# Patient Record
Sex: Male | Born: 1960 | Race: Black or African American | Hispanic: No | Marital: Married | Smoking: Current every day smoker
Health system: Southern US, Community
[De-identification: ages and names within clinical notes are randomized; demographics above are authoritative.]

## PROBLEM LIST (undated history)

## (undated) DIAGNOSIS — I10 Essential (primary) hypertension: Secondary | ICD-10-CM

## (undated) DIAGNOSIS — F200 Paranoid schizophrenia: Secondary | ICD-10-CM

## (undated) DIAGNOSIS — Z789 Other specified health status: Secondary | ICD-10-CM

## (undated) DIAGNOSIS — J45909 Unspecified asthma, uncomplicated: Secondary | ICD-10-CM

## (undated) HISTORY — PX: APPENDECTOMY: SHX54

## (undated) HISTORY — DX: Essential (primary) hypertension: I10

## (undated) HISTORY — PX: APPENDECTOMY (OPEN): SHX54

---

## 2001-05-20 ENCOUNTER — Ambulatory Visit (INDEPENDENT_AMBULATORY_CARE_PROVIDER_SITE_OTHER): Admit: 2001-05-20 | Disposition: A | Discharge status: Home / Self Care | Payer: Self-pay | Source: Ambulatory Visit

## 2001-05-20 ENCOUNTER — Emergency Department: Admit: 2001-05-20 | Payer: Self-pay | Source: Emergency Department | Admitting: Emergency Medicine

## 2002-04-26 ENCOUNTER — Ambulatory Visit (INDEPENDENT_AMBULATORY_CARE_PROVIDER_SITE_OTHER): Admit: 2002-04-26 | Disposition: A | Discharge status: Home / Self Care | Payer: Self-pay | Source: Ambulatory Visit

## 2003-09-07 ENCOUNTER — Emergency Department: Admit: 2003-09-07 | Payer: Self-pay | Source: Emergency Department | Admitting: Internal Medicine

## 2003-09-07 ENCOUNTER — Ambulatory Visit: Admit: 2003-09-07 | Disposition: A | Discharge status: Home / Self Care | Payer: Self-pay | Source: Ambulatory Visit

## 2004-08-07 ENCOUNTER — Emergency Department: Admit: 2004-08-07 | Payer: Self-pay | Source: Emergency Department | Admitting: Emergency Medicine

## 2004-08-07 ENCOUNTER — Ambulatory Visit: Admit: 2004-08-07 | Disposition: A | Discharge status: Home / Self Care | Payer: Self-pay | Source: Ambulatory Visit

## 2011-04-26 ENCOUNTER — Ambulatory Visit (INDEPENDENT_AMBULATORY_CARE_PROVIDER_SITE_OTHER)
Admit: 2011-04-26 | Discharge: 2011-04-26 | Disposition: A | Discharge status: Home / Self Care | Payer: Self-pay | Source: Ambulatory Visit

## 2011-04-26 ENCOUNTER — Emergency Department
Admit: 2011-04-26 | Disposition: A | Discharge status: Home / Self Care | Payer: Self-pay | Source: Emergency Department | Admitting: Emergency Medicine

## 2014-05-18 ENCOUNTER — Ambulatory Visit
Admission: RE | Admit: 2014-05-18 | Discharge: 2014-05-18 | Disposition: A | Discharge status: Home / Self Care | Source: Ambulatory Visit

## 2014-05-18 ENCOUNTER — Emergency Department

## 2014-05-18 ENCOUNTER — Emergency Department: Payer: Self-pay

## 2014-05-18 ENCOUNTER — Emergency Department
Admission: EM | Admit: 2014-05-18 | Discharge: 2014-05-18 | Disposition: A | Discharge status: Home / Self Care | Attending: Emergency Medicine | Admitting: Emergency Medicine

## 2014-05-18 DIAGNOSIS — S8010XA Contusion of unspecified lower leg, initial encounter: Secondary | ICD-10-CM | POA: Insufficient documentation

## 2014-05-18 DIAGNOSIS — IMO0002 Reserved for concepts with insufficient information to code with codable children: Secondary | ICD-10-CM | POA: Insufficient documentation

## 2014-05-18 DIAGNOSIS — Y9289 Other specified places as the place of occurrence of the external cause: Secondary | ICD-10-CM | POA: Insufficient documentation

## 2014-05-18 DIAGNOSIS — I10 Essential (primary) hypertension: Secondary | ICD-10-CM | POA: Insufficient documentation

## 2014-05-18 DIAGNOSIS — S8012XA Contusion of left lower leg, initial encounter: Secondary | ICD-10-CM

## 2014-05-18 DIAGNOSIS — F17200 Nicotine dependence, unspecified, uncomplicated: Secondary | ICD-10-CM | POA: Insufficient documentation

## 2014-05-18 DIAGNOSIS — S80812A Abrasion, left lower leg, initial encounter: Secondary | ICD-10-CM

## 2014-05-18 NOTE — ED Notes (Addendum)
Pt d/c to home. Stable, ambulates with steady gait. All belongings with pt.     Notified Dr Salley Scarlet regarding BP of 164/98. No new orders at this time. Pt is upset that he has to wait to get a drug screen in which pt's work had requested.

## 2014-05-18 NOTE — ED Provider Notes (Signed)
Physician/Midlevel provider first contact with patient: 05/18/14 0403             Physician/Midlevel provider first contact with patient: 05/18/14 0403          [facility] EMERGENCY DEPARTMENT H&P         CLINICAL SUMMARY          Diagnosis:    .     Final diagnoses:   Traumatic hematoma of lower leg, left, initial encounter   Abrasion, lower leg, anterior, left, initial encounter         MDM Notes:    MDM, Differential Diagnosis (not completely inclusive) and Plan: Leg injury- possible fx, likely hematoma.  Will check xray, po pain control.             Disposition:      ED Disposition    Discharge Lance Lara discharge to home/self care.    Condition at disposition: Stable            New Prescriptions    No medications on file                 CLINICAL INFORMATION        HPI:      Chief Complaint: Leg Injury  .    Lance Lara is a 53 y.o. male who presents with pain to left shin.  Patient relates while at work a metal ramp (patient relates approx. 300 pounds) slid back and struck him in shin.  Patient fell backwards but denies other injury- no head injury, no loc.  Last Td <5years ago.  Injury occurred prior to arrival in ED.  "Shooting" pain, radiates up left leg, 8/10, abrupt onset, pain increases with ambulation.       History obtained from: Patient      Symptoms have worsened in past 24-48 hours: yes    PMD   No primary care provider on file.      ROS:      -Documented in HPI. Otherwise all other systems reviewed and negative.   -Nursing notes reviewed by me.         Physical Exam:      PE   BP: 167/94 mmHg, Temp: 98.4 F (36.9 C), Heart Rate: 89, Resp Rate: 18, SpO2: 99 %, Height: 175.3 cm, Weight: 74.844 kg    Pulse Oximetry Analysis -99 % on room air; Normal  Vitals reviewed   GEN: . Nontoxic, Well appearing, NAD.          Head: NC/AT.       Eyes: EOMI w/o pain, nl conjunctiva. NO d/c.         ENT: MMM, symmetric OP, no drooling/trismus.          Neck: FROM, supple, no masses, no tenderness.          Chest: ctab, NO resp distress. Equal chest rise.          CV: rrr, NO significant murmur appreciated.          Abd: no peritoneal signs, soft, benign, no distention, no tenderness             Back: NO CVAt, NO midline ttp.         UpperExt: NO deformity. FROM, neurovasc intact.         LowerExt: NO edema. FROM, neurovasc intact.    Abrasion and hematoma to left distal tib/fib, no obvious deformity, +ttp, no pain in ankle or knee to plapation  Neuro: moving all ext equally, no motor deficits, normal sensation     Skin: Warm and dry. NO rash.          Psych: Normal affect. Normal insight.                      PAST HISTORY        Primary Care Provider: No primary care provider on file.        PMH/PSH:    .     Past Medical History   Diagnosis Date   . Hypertension        He has past surgical history that includes Appendectomy.      Social/Family History:      He reports that he has been smoking.  He does not have any smokeless tobacco history on file. He reports that he drinks alcohol. He reports that he does not use illicit drugs.    History reviewed. No pertinent family history.      Listed Medications on Arrival:    .     Previous Medications    No medications on file      Allergies: He is allergic to ciprofloxacin.            VISIT INFORMATION        Clinical Course in the ED:            Medications Given in the ED:    .     ED Medication Orders    None            Procedures:      Procedures      Interpretations:      EKG: none     Cardiac monitor interpretation by me, Dr. Lucah Petta:none  Radiologic Studies Interpreted (viewed) by me, Dr. Salley Scarlet: neg for fx  Laboratory results reviewed by EDP: YES   Radiologic study results reviewed by EDP: YES               RESULTS        Lab Results:      Results    ** No results found for the last 24 hours. **              Radiology Results:      Tibia Fibula Left AP And Lateral    (Results Pending)               Scribe Attestation:      No scribe involved in the  care of this patient       Noralyn Pick, MD             Judi Saa, MD  05/18/14 [phone]

## 2014-05-18 NOTE — Discharge Instructions (Signed)
Abrasion    You have been diagnosed with an abrasion. This is a scrape of the outer skin layers.    Take off old dressings every day. Then put on a clean, dry dressing. If the dressing sticks to the wound, moisten it with water. This way, it can come off more easily.    Keep the wound clean and dry for the next 24 hours. You can wash the wound gently with soap and water. Then put on a dry bandage if needed, to protect it.    Put a thin layer of antibiotic ointment on the wound 2-3 times a day. This can be Polysporin / triple antibiotic. This can help prevent infection. It may help keep scarring to a minimum.    YOU SHOULD SEEK MEDICAL ATTENTION IMMEDIATELY, EITHER HERE OR AT THE NEAREST EMERGENCY DEPARTMENT, IF ANY OF THE FOLLOWING OCCURS:   Unusual redness or swelling.   There are red streaks going up the arm or leg.   The wound smells bad or has a lot of drainage.   Fever (temperature higher than 100.58F / 38C), chills, more pain and / or swelling.    Hematoma    You have been diagnosed with a hematoma.    A hematoma is a collection of blood under the skin usually caused by injury to the area. In other words, it is a very large bruise.    Put ice on the area 15 minutes out of every hour to help with swelling and pain. Putting ice on the affected area can reduce pain and swelling. Put some ice cubes in a re-sealable (Ziploc) bag. Add some water. Put a thin washcloth between the bag and the skin. Apply the ice bag to the area for at least 20 minutes. Do this at least 4 times per day. Longer times and more often are OK. NEVER APPLY ICE DIRECTLY TO THE SKIN! If your injury is on your hand or foot, lift it above the level of your heart to help with swelling.    YOU SHOULD SEEK MEDICAL ATTENTION IMMEDIATELY, EITHER HERE OR AT THE NEAREST EMERGENCY DEPARTMENT, IF ANY OF THE FOLLOWING OCCURS:   The hematoma keeps getting bigger.   Fever (temperature higher than 100.58F / 38C) or shaking  chills.   Redness that surrounds or spreads outward from the area.   Breakdown of the skin in the area affected.   Very bad-smelling drainage from the injured area.         skin. Apply the ice bag for at least 20 minutes. Do this at least 4 times per day. It's okay to apply ice longer or more often. NEVER APPLY ICE DIRECTLY TO THE SKIN. Always keep a washcloth between the ice pack and your body.    You have been given an ACE BANDAGE. The bandage will compress (squeeze) the injured area. This increases comfort and reduces swelling. The ACE bandage should fit snugly but not so tight as to decrease circulation (blood supply). Watch for swelling of the area outside the ACE wrap. Check capillary refill (circulation) in your fingernails or toenails. To do this, press on the nail. It should turn white. When you let go, the nail should return to pink in less than 2 seconds. If it doesn't, the bandage is too tight. Loosen the wrap if you need to.   Wear the ACE bandage as needed for comfort for the next few days.    YOU SHOULD SEEK MEDICAL ATTENTION IMMEDIATELY, EITHER HERE OR  ANY OF THE FOLLOWING OCCURS:   Your pain or swelling gets much worse.   You develop new numbness or tingling in or below the affected area.   Your foot or hand looks cold or pale. This could mean there is a problem with circulation (blood supply).

## 2014-05-18 NOTE — ED Notes (Signed)
onset about 0230. pt was at work and was hit by a metal ramp to left lower leg, pain is now shooting up left upper thigh. full ROM. pain increase with ambulation.

## 2019-02-18 ENCOUNTER — Ambulatory Visit
Admission: RE | Admit: 2019-02-18 | Discharge: 2019-02-18 | Disposition: A | Discharge status: Home / Self Care | Payer: Self-pay | Source: Ambulatory Visit | Attending: Family Medicine | Admitting: Family Medicine

## 2019-02-18 ENCOUNTER — Other Ambulatory Visit: Payer: Self-pay | Admitting: Family Medicine

## 2019-02-18 DIAGNOSIS — M5416 Radiculopathy, lumbar region: Secondary | ICD-10-CM

## 2019-05-12 ENCOUNTER — Encounter (HOSPITAL_COMMUNITY): Payer: Self-pay

## 2019-05-12 ENCOUNTER — Emergency Department (HOSPITAL_COMMUNITY)
Admission: EM | Admit: 2019-05-12 | Discharge: 2019-05-13 | Disposition: A | Discharge status: Other Acute Inpatient Hospital | Payer: No Typology Code available for payment source | Attending: Emergency Medicine | Admitting: Emergency Medicine

## 2019-05-12 ENCOUNTER — Other Ambulatory Visit: Payer: Self-pay

## 2019-05-12 DIAGNOSIS — Z79899 Other long term (current) drug therapy: Secondary | ICD-10-CM | POA: Insufficient documentation

## 2019-05-12 DIAGNOSIS — Z046 Encounter for general psychiatric examination, requested by authority: Secondary | ICD-10-CM

## 2019-05-12 DIAGNOSIS — F333 Major depressive disorder, recurrent, severe with psychotic symptoms: Secondary | ICD-10-CM | POA: Insufficient documentation

## 2019-05-12 DIAGNOSIS — R45851 Suicidal ideations: Secondary | ICD-10-CM | POA: Insufficient documentation

## 2019-05-12 DIAGNOSIS — Z20828 Contact with and (suspected) exposure to other viral communicable diseases: Secondary | ICD-10-CM | POA: Insufficient documentation

## 2019-05-12 LAB — CBC
HCT: 48.3 % (ref 39.0–52.0)
Hemoglobin: 16.1 g/dL (ref 13.0–17.0)
MCH: 30.7 pg (ref 26.0–34.0)
MCHC: 33.3 g/dL (ref 30.0–36.0)
MCV: 92.2 fL (ref 80.0–100.0)
Platelets: 245 10*3/uL (ref 150–400)
RBC: 5.24 MIL/uL (ref 4.22–5.81)
RDW: 14.6 % (ref 11.5–15.5)
WBC: 11.4 10*3/uL — ABNORMAL HIGH (ref 4.0–10.5)
nRBC: 0 % (ref 0.0–0.2)

## 2019-05-12 LAB — ETHANOL: Alcohol, Ethyl (B): <10 mg/dL (ref <10)

## 2019-05-12 LAB — RAPID URINE DRUG SCREEN, HOSP PERFORMED
Amphetamines: NOT DETECTED
Barbiturates: NOT DETECTED
Benzodiazepines: NOT DETECTED
Cocaine: NOT DETECTED
Opiates: NOT DETECTED
Tetrahydrocannabinol: NOT DETECTED

## 2019-05-12 LAB — COMPREHENSIVE METABOLIC PANEL
ALT: 21 U/L (ref 0–44)
AST: 16 U/L (ref 15–41)
Albumin: 4.5 g/dL (ref 3.5–5.0)
Alkaline Phosphatase: 66 U/L (ref 38–126)
Anion gap: 9 (ref 5–15)
BUN: 15 mg/dL (ref 6–20)
CO2: 25 mmol/L (ref 22–32)
Calcium: 9.1 mg/dL (ref 8.9–10.3)
Chloride: 104 mmol/L (ref 98–111)
Creatinine, Ser: 1.18 mg/dL (ref 0.61–1.24)
GFR calc Af Amer: >60 mL/min (ref >60)
GFR calc non Af Amer: >60 mL/min (ref >60)
Glucose, Bld: 86 mg/dL (ref 70–99)
Potassium: 4.1 mmol/L (ref 3.5–5.1)
Sodium: 138 mmol/L (ref 135–145)
Total Bilirubin: 0.4 mg/dL (ref 0.3–1.2)
Total Protein: 7.9 g/dL (ref 6.5–8.1)

## 2019-05-12 LAB — ACETAMINOPHEN LEVEL: Acetaminophen (Tylenol), Serum: <10 ug/mL — ABNORMAL LOW (ref 10–30)

## 2019-05-12 LAB — SALICYLATE LEVEL: Salicylate Lvl: <7 mg/dL (ref 2.8–30.0)

## 2019-05-12 MED ORDER — LORAZEPAM 2 MG/ML IJ SOLN
1.0000 mg | Freq: Once | INTRAMUSCULAR | Status: DC | PRN
  Filled 2019-05-12: qty 1

## 2019-05-12 MED ORDER — IBUPROFEN 200 MG PO TABS: 600.0000 mg | ORAL_TABLET | Freq: Three times a day (TID) | ORAL | Status: DC | PRN

## 2019-05-12 MED ORDER — ZOLPIDEM TARTRATE 5 MG PO TABS: 5.0000 mg | ORAL_TABLET | Freq: Every evening | ORAL | Status: DC | PRN

## 2019-05-12 MED ORDER — OLANZAPINE 10 MG IM SOLR: 5.0000 mg | Freq: Once | INTRAMUSCULAR | Status: DC | PRN

## 2019-05-12 MED ORDER — OLANZAPINE 5 MG PO TBDP: 5.0000 mg | ORAL_TABLET | Freq: Once | ORAL | Status: DC | PRN

## 2019-05-12 MED ORDER — LORAZEPAM 1 MG PO TABS: 1.0000 mg | ORAL_TABLET | ORAL | Status: DC | PRN

## 2019-05-12 NOTE — ED Notes (Signed)
Brought in by [facility] after first going to [facility] and being told they were full and brought him over here. Sent back to triage and then brought back here. He is pending IVC papers. [employer] present with him now. He has a flat affect, stares unblinkingly at Probation officer during interview. Slow to respond with answers and vague. Denies any hx of mental health issues and doesn't recognize any dx when Probation officer listed several for him. He denies being on any medicines. Denies pain or medical issues and other than smokes cigarettes he denies drug and ETOH use.  Urine collected, waiting on results. He does endorse at night feeling like someone is trying to "mess with him" but was not more specific. He has an unsettling affect. IVC papers initiated by his therapist stating he is not taking his meds, acting bizarre and feels [place] has him and he feels unsafe and has been wanting to sleep in the bed of his elderly aunt.

## 2019-05-12 NOTE — ED Provider Notes (Signed)
[facility] DEPT Provider Note   CSN: [id number] Arrival date & time: 05/12/19  2112     History   Chief Complaint Chief Complaint  Patient presents with  . IVC  . Suicidal    HPI Everette Fitzwater is a 58 y.o. male.     The history is provided by the patient and medical records.    58 year old male with history of hypertension and psychiatric illness, presenting to the ED under IVC petition by his therapist.  Apparently he was taken to behavioral health by police for psychiatric evaluation.  Apparently patient had been calling 911 multiple times today, so [employer] actually went to his home to arrest him for misuse of emergency services, but family spoke with them and told them full extent of what is been going on.  Apparently he is not been taking his medications, has been complaining that someone is "messing with him" at night, and that the devil is out to get him.  He also made a statement to the police about wanting to sleep in the bed with his elderly aunt so that he "can be safe".  There was no statements made about abuse.  Here, patient is denying any history of psychiatric disease.  He denies any alcohol or illicit drug use.  He denies any physical complaints, just states "I want to go home".  History reviewed. No pertinent past medical history.  There are no active problems to display for this patient.   History reviewed. No pertinent surgical history.      Home Medications    Prior to Admission medications   Medication Sig Start Date End Date Taking? Authorizing Provider  amLODipine (NORVASC) 10 MG tablet Take 10 mg by mouth daily. 04/04/19  Yes [provider]  PROAIR HFA 108 (90 Base) MCG/ACT inhaler Inhale 1 puff into the lungs every 6 (six) hours as needed for wheezing. 12/24/18  Yes [provider]    Family History History reviewed. No pertinent family history.  Social History Social History   Tobacco Use  .  Smoking status: Not on file  Substance Use Topics  . Alcohol use: Not on file  . Drug use: Not on file     Allergies   Ciprofloxacin hcl   Review of Systems Review of Systems  Psychiatric/Behavioral:       IVC  All other systems reviewed and are negative.    Physical Exam Updated Vital Signs BP (!) 156/91 (BP Location: Left Arm)   Pulse 87   Resp 16   SpO2 99%   Physical Exam Vitals signs and nursing note reviewed.  Constitutional:      Appearance: He is well-developed.  HENT:     Head: Normocephalic and atraumatic.  Eyes:     Conjunctiva/sclera: Conjunctivae normal.     Pupils: Pupils are equal, round, and reactive to light.  Neck:     Musculoskeletal: Normal range of motion.  Cardiovascular:     Rate and Rhythm: Normal rate and regular rhythm.     Heart sounds: Normal heart sounds.  Pulmonary:     Effort: Pulmonary effort is normal.     Breath sounds: Normal breath sounds.  Abdominal:     General: Bowel sounds are normal.     Palpations: Abdomen is soft.  Musculoskeletal: Normal range of motion.  Skin:    General: Skin is warm and dry.  Neurological:     Mental Status: He is alert and oriented to person, place, and  time.  Psychiatric:     Comments: Very bizarre affect, flat, staring off in space throughout exam Harsh answers to questions without much detail      ED Treatments / Results  Labs (all labs ordered are listed, but only abnormal results are displayed) Labs Reviewed  ACETAMINOPHEN LEVEL - Abnormal; Notable for the following components:      Result Value   Acetaminophen (Tylenol), Serum <10 (*)    All other components within normal limits  CBC - Abnormal; Notable for the following components:   WBC 11.4 (*)    All other components within normal limits  SARS CORONAVIRUS 2 (HOSPITAL ORDER, PERFORMED IN [facility])  COMPREHENSIVE METABOLIC PANEL  ETHANOL  SALICYLATE LEVEL  RAPID URINE DRUG SCREEN, HOSP PERFORMED    EKG  None  Radiology No results found.  Procedures Procedures (including critical care time)  Medications Ordered in ED Medications  zolpidem (AMBIEN) tablet 5 mg (has no administration in time range)  ibuprofen (ADVIL) tablet 600 mg (has no administration in time range)  OLANZapine (ZYPREXA) injection 5 mg (has no administration in time range)  LORazepam (ATIVAN) injection 1 mg (has no administration in time range)     Initial Impression / Assessment and Plan / ED Course  I have reviewed the triage vital signs and the nursing notes.  Pertinent labs & imaging results that were available during my care of the patient were reviewed by me and considered in my medical decision making (see chart for details).  58 year old male here under IVC petitioned by family due to erratic behavior.  He is apparently been complaining that the devil has hold of him, people are out to get him, and even made some statements about wanting to sleep in the bed with his elderly aunt for protection.  There was no complaints of abuse.  He is apparently been off of his psychiatric medications for unknown period of time.  Here patient's affect is extremely odd, he will not maintain eye contact, answers are brief and harsh without any supporting details.  He is adamantly denying any history of psychiatric disease and states he wants to go home.  Denies any alcohol or illicit drug use.  Screening labs obtained and are overall reassuring.  Patient is medically cleared.  TTS has evaluated, recommends IP placement.  [facility], will seek outside placement.  Will obtain COVID screen.  Final Clinical Impressions(s) / ED Diagnoses   Final diagnoses:  Involuntary commitment    ED Discharge Orders    None       Garlon HatchetSanders, Yaneli Keithley M, PA-C 05/13/19 [id number]    Marily MemosMesner, Jason, MD 05/13/19 [phone]
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
## 2019-05-13 ENCOUNTER — Encounter (HOSPITAL_COMMUNITY): Payer: Self-pay | Admitting: *Deleted

## 2019-05-13 ENCOUNTER — Observation Stay (HOSPITAL_COMMUNITY)
Admission: AD | Admit: 2019-05-13 | Discharge: 2019-05-14 | Disposition: A | Discharge status: Home / Self Care | Payer: Federal, State, Local not specified - Other | Source: Intra-hospital | Attending: Psychiatry | Admitting: Psychiatry

## 2019-05-13 DIAGNOSIS — F333 Major depressive disorder, recurrent, severe with psychotic symptoms: Secondary | ICD-10-CM

## 2019-05-13 DIAGNOSIS — F4323 Adjustment disorder with mixed anxiety and depressed mood: Secondary | ICD-10-CM | POA: Diagnosis not present

## 2019-05-13 DIAGNOSIS — Z79899 Other long term (current) drug therapy: Secondary | ICD-10-CM | POA: Insufficient documentation

## 2019-05-13 DIAGNOSIS — F332 Major depressive disorder, recurrent severe without psychotic features: Secondary | ICD-10-CM | POA: Diagnosis not present

## 2019-05-13 DIAGNOSIS — R45851 Suicidal ideations: Secondary | ICD-10-CM | POA: Insufficient documentation

## 2019-05-13 DIAGNOSIS — F23 Brief psychotic disorder: Secondary | ICD-10-CM | POA: Diagnosis present

## 2019-05-13 DIAGNOSIS — F17210 Nicotine dependence, cigarettes, uncomplicated: Secondary | ICD-10-CM | POA: Insufficient documentation

## 2019-05-13 DIAGNOSIS — Z888 Allergy status to other drugs, medicaments and biological substances status: Secondary | ICD-10-CM | POA: Insufficient documentation

## 2019-05-13 DIAGNOSIS — J45909 Unspecified asthma, uncomplicated: Secondary | ICD-10-CM | POA: Diagnosis not present

## 2019-05-13 DIAGNOSIS — G4700 Insomnia, unspecified: Secondary | ICD-10-CM | POA: Diagnosis not present

## 2019-05-13 DIAGNOSIS — Z20828 Contact with and (suspected) exposure to other viral communicable diseases: Secondary | ICD-10-CM | POA: Diagnosis not present

## 2019-05-13 DIAGNOSIS — I10 Essential (primary) hypertension: Secondary | ICD-10-CM | POA: Insufficient documentation

## 2019-05-13 HISTORY — DX: Essential (primary) hypertension: I10

## 2019-05-13 HISTORY — DX: Unspecified asthma, uncomplicated: J45.909

## 2019-05-13 HISTORY — DX: Other specified health status: Z78.9

## 2019-05-13 LAB — SARS CORONAVIRUS 2 BY RT PCR (HOSPITAL ORDER, PERFORMED IN ~~LOC~~ HOSPITAL LAB): SARS Coronavirus 2: NEGATIVE

## 2019-05-13 MED ORDER — HALOPERIDOL 2 MG PO TABS: 2.0000 mg | ORAL_TABLET | Freq: Two times a day (BID) | ORAL | Status: DC

## 2019-05-13 MED ORDER — HYDROXYZINE HCL 25 MG PO TABS
25.0000 mg | ORAL_TABLET | Freq: Three times a day (TID) | ORAL | Status: DC | PRN
  Administered 2019-05-13: 25 mg via ORAL
  Filled 2019-05-13: qty 1

## 2019-05-13 MED ORDER — HALOPERIDOL 2 MG PO TABS
2.0000 mg | ORAL_TABLET | Freq: Two times a day (BID) | ORAL | Status: DC
  Administered 2019-05-13: 19:00:00 2 mg via ORAL
  Filled 2019-05-13: qty 1

## 2019-05-13 MED ORDER — ALUM & MAG HYDROXIDE-SIMETH 200-200-20 MG/5ML PO SUSP: 30.0000 mL | ORAL | Status: DC | PRN

## 2019-05-13 MED ORDER — TRAZODONE HCL 50 MG PO TABS
50.0000 mg | ORAL_TABLET | Freq: Every evening | ORAL | Status: DC | PRN
  Administered 2019-05-13: 23:00:00 50 mg via ORAL
  Filled 2019-05-13: qty 1

## 2019-05-13 MED ORDER — MAGNESIUM HYDROXIDE 400 MG/5ML PO SUSP: 30.0000 mL | Freq: Every day | ORAL | Status: DC | PRN

## 2019-05-13 MED ORDER — HALOPERIDOL 1 MG PO TABS: 2.0000 mg | ORAL_TABLET | Freq: Two times a day (BID) | ORAL | Status: DC

## 2019-05-13 MED ORDER — FLUOXETINE HCL 10 MG PO CAPS: 10.0000 mg | ORAL_CAPSULE | Freq: Every day | ORAL | Status: DC

## 2019-05-13 MED ORDER — IBUPROFEN 600 MG PO TABS: 600.0000 mg | ORAL_TABLET | Freq: Three times a day (TID) | ORAL | Status: DC | PRN

## 2019-05-13 MED ORDER — LORAZEPAM 2 MG/ML IJ SOLN
2.0000 mg | Freq: Once | INTRAMUSCULAR | Status: AC
  Administered 2019-05-13: 2 mg via INTRAVENOUS

## 2019-05-13 MED ORDER — ZIPRASIDONE MESYLATE 20 MG IM SOLR
INTRAMUSCULAR | Status: AC
  Filled 2019-05-13: qty 20

## 2019-05-13 MED ORDER — OLANZAPINE 10 MG IM SOLR: 5.0000 mg | Freq: Once | INTRAMUSCULAR | Status: DC | PRN

## 2019-05-13 MED ORDER — ZIPRASIDONE MESYLATE 20 MG IM SOLR
20.0000 mg | Freq: Once | INTRAMUSCULAR | Status: AC
  Administered 2019-05-13: 07:00:00 20 mg via INTRAMUSCULAR

## 2019-05-13 MED ORDER — ACETAMINOPHEN 325 MG PO TABS: 650.0000 mg | ORAL_TABLET | Freq: Four times a day (QID) | ORAL | Status: DC | PRN

## 2019-05-13 NOTE — Progress Notes (Signed)
Admit Note Pt is IVCD,admitted to OBS unit from [facility] due to increased paranoia and poor judgement . Pt is delusional." It is judgement day and people are putting stuff in your black and mild cigars and telling you to smoke it."Pt reports he lost his job and that people are playing mind games the patient states he has 4 daughters but they can't be trusted either because they're with her.Pt lives his Elenor Legato who is 58 y/o [phone].Pt also feels people are sneaking in his bed at night and having sex with him.. Medical HX includes HTN, Oriented to the unit, Education provided about safety on the unit, including fall prevention. Nutrition offered, safety checks initiated every 15 minutes. Search completed.

## 2019-05-13 NOTE — ED Notes (Addendum)
Pt walked into hall asking to leave. Pt was then asked by staff to return to his room, Pt asked writer "why?' and continued to walked towards the door. [employer] had to redirect pt back to his room.

## 2019-05-13 NOTE — Progress Notes (Signed)

## 2019-05-13 NOTE — Plan of Care (Signed)
[facility] Observation Crisis Plan  Reason for Crisis Plan:  Crisis Stabilization   Plan of Care:  Referral for Inpatient Hospitalization  Family Support:      Current Living Environment:  Living Arrangements: Other relatives  Insurance:   Hospital Account    Name Acct ID Class Status Primary Coverage   Fernando Ferguson, Fernando Ferguson [id number] [place] [facility]/[place]        Guarantor Account (for Hospital Account [id number])    Name Relation to [place]? Acct Type   Fernando Ferguson Self [facility] Yes [facility]   Address Phone       [address] [place], [place] [address] [phone])          Coverage Information (for Hospital Account [id number])    F/O Payor/Plan Precert #   [place] [facility]/[place] #   Fernando Ferguson, Fernando Ferguson [id number]   Address Phone   [address] [place],  [address] [phone]      Legal Guardian:     Primary Care Provider:  Vassie Moment, MD  Current Outpatient Providers:  Beverly Sessions  Psychiatrist:     Counselor/Therapist:     Compliant with Medications:  No  Additional Information:   Fernando Ferguson 8/7/20207:32 PM

## 2019-05-13 NOTE — BH Assessment (Signed)
[place] Assessment Progress Note  Per Renetta Chalk, NP, this pt meets criteria for IVC, and is to be admitted to the [facility] Observation Unit.  Letitia Libra, RN, [facility] has assigned pt to [facility].  Pt presents under IVC initiated by a Special educational needs teacher, and upheld by EDP Virgel Manifold, MD, and IVC documents have been faxed to [facility].  Pt's nurse, Diane, has been notified, and agrees to call report to [phone].  Pt is to be transported via Event organ[employer].   Jalene Mullet, [place] Coordinator [phone]

## 2019-05-13 NOTE — ED Notes (Signed)
Tech approached him this am to get a set of vitals on him, he responded "you all are playing a game" he did allow her to take his vitals, Probation officer had officer stand near by. He seems paranoid.

## 2019-05-13 NOTE — ED Notes (Signed)
Pt came up to desk asking for his wallet. Pt was asked to go back to his room several times. [employer] had to escort pt back to his room because he refused to go when staff and security asked him to.

## 2019-05-13 NOTE — Progress Notes (Signed)
In hallway, stating to "watch your back in this world." reports SI thoughts and "am just ready to go."  Appears paranoid and restless, pacing in hallway and in and out of dayroom. Medications given as scheduled. Slight resistance, with encouragement medication taken. Spoke to [employer] on the phone. Pt is safe

## 2019-05-13 NOTE — ED Notes (Signed)
Transported to [facility] Obs Unit by [employer]. Belongings returned to [facility] for pt. Pt was calm and cooperative.

## 2019-05-13 NOTE — H&P (Addendum)
[facility] Provider Admission PAA/H&P  Patient Identification: Lenell Lama MRN:  [id number] Date of Evaluation:  05/13/2019 Chief Complaint:  mdd recurrent severe  Principal Diagnosis: Brief psychotic disorder ([place]) Diagnosis:  Principal Problem:   Brief psychotic disorder ([place])  History of Present Illness: Pt presented to [facility] with IVC paperwork taken out by his aunt, who he lives with. Please see TTS consult note for collateral from Pt's aunt. In short, he is going through a divorce and custody battle, he is not into drugs, alcohol or in trouble, he works, and he has been talking about suicide and thinking people are after him.  Today, during evaluation, He stated that he has not been sleeping because he thinks someone is coming in the house and playing tricks on him. He stated he hasn't been eating because someone is throwing the food in the trash and he has to get it out. He stated he has never been in a psychiatric hospital and has never taken any medication for his mental health. He did state he saw a Dr the other day and was given something for his blood pressure, he thinks it started with an L. His pressure on admission to OBS was 140/94 standing. He also stated the Dr's office was over near [facility]. He rambles and does not make a lot of sense. His aunt indicated that he has been like this since they started switching his shifts at work. He is fearful to be alone and has even asked his aunt (she is 41) if he can sleep in the bed with her because he is frightened. He is pleasant, polite and calm. He ate some dinner and asked for a nicotine patch and gum. He will remain in OBS overnight and be re-evaluated by psychiatry in the AM. With no history in Epic and no antipsychotics listed, will try Haldol 2 mg BID for psychosis and sleep.   Associated Signs/Symptoms: Depression Symptoms:  depressed mood, anhedonia, insomnia, difficulty concentrating, suicidal thoughts without  plan, anxiety, disturbed sleep, decreased appetite, (Hypo) Manic Symptoms:  Delusions, Distractibility, Hallucinations, Anxiety Symptoms:  Excessive Worry, Psychotic Symptoms:  Delusions, Paranoia, PTSD Symptoms: NA Total Time spent with patient: 30 minutes  Past Psychiatric History: None known  Is the patient at risk to self? Yes.    Has the patient been a risk to self in the past 6 months? No.  Has the patient been a risk to self within the distant past? No.  Is the patient a risk to others? No.  Has the patient been a risk to others in the past 6 months? No.  Has the patient been a risk to others within the distant past? No.   Prior Inpatient Therapy:  None Prior Outpatient Therapy:  None  Alcohol Screening:   Substance Abuse History in the last 12 months:  No. Consequences of Substance Abuse: NA Previous Psychotropic Medications: Pt denies Psychological Evaluations: No  Past Medical History: No past medical history on file. No past surgical history on file. Family History: No family history on file. Family Psychiatric History: Pt did not give this information Tobacco Screening:  Yes Social History:  Social History   Substance and Sexual Activity  Alcohol Use None     Social History   Substance and Sexual Activity  Drug Use Not on file    Additional Social History:  Allergies:   Allergies  Allergen Reactions  . Ciprofloxacin Hcl Swelling   Lab Results:  Results for orders placed or  performed during the hospital encounter of 05/12/19 (from the past 48 hour(s))  Comprehensive metabolic panel     Status: None   Collection Time: 05/12/19  9:48 PM  Result Value Ref Range   Sodium 138 135 - 145 mmol/L   Potassium 4.1 3.5 - 5.1 mmol/L   Chloride 104 98 - 111 mmol/L   CO2 25 22 - 32 mmol/L   Glucose, Bld 86 70 - 99 mg/dL   BUN 15 6 - 20 mg/dL   Creatinine, Ser 2.951.18 0.61 - 1.24 mg/dL   Calcium 9.1 8.9 - 62.110.3 mg/dL   Total Protein 7.9 6.5 - 8.1 g/dL    Albumin 4.5 3.5 - 5.0 g/dL   AST 16 15 - 41 U/L   ALT 21 0 - 44 U/L   Alkaline Phosphatase 66 38 - 126 U/L   Total Bilirubin 0.4 0.3 - 1.2 mg/dL   GFR calc non Af Amer >60 >60 mL/min   GFR calc Af Amer >60 >60 mL/min   Anion gap 9 5 - 15    Comment: Performed at [facility], [address] [place], [place] [address]  Ethanol     Status: None   Collection Time: 05/12/19  9:48 PM  Result Value Ref Range   Alcohol, Ethyl (B) <10 <10 mg/dL    Comment: (NOTE) Lowest detectable limit for serum alcohol is 10 mg/dL. For medical purposes only. Performed at [facility], [address] [place], [place] [address]   Salicylate level     Status: None   Collection Time: 05/12/19  9:48 PM  Result Value Ref Range   Salicylate Lvl <7.0 2.8 - 30.0 mg/dL    Comment: Performed at [facility], [address] [place], [place] [address]  Acetaminophen level     Status: Abnormal   Collection Time: 05/12/19  9:48 PM  Result Value Ref Range   Acetaminophen (Tylenol), Serum <10 (L) 10 - 30 ug/mL    Comment: (NOTE) Therapeutic concentrations vary significantly. A range of 10-30 ug/mL  may be an effective concentration for many patients. However, some  are best treated at concentrations outside of this range. Acetaminophen concentrations >150 ug/mL at 4 hours after ingestion  and >50 ug/mL at 12 hours after ingestion are often associated with  toxic reactions. Performed at [facility], [address] [place], [place] [address]   cbc     Status: Abnormal   Collection Time: 05/12/19  9:48 PM  Result Value Ref Range   WBC 11.4 (H) 4.0 - 10.5 K/uL   RBC 5.24 4.22 - 5.81 MIL/uL   Hemoglobin 16.1 13.0 - 17.0 g/dL   HCT 44.048.3 10.239.0 - 72.552.0 %   MCV 92.2 80.0 - 100.0 fL   MCH 30.7 26.0 - 34.0 pg   MCHC 33.3 30.0 - 36.0 g/dL   RDW 36.614.6 44.011.5 - 34.715.5 %   Platelets 245 150 - 400 K/uL   nRBC 0.0 0.0 - 0.2 %    Comment: Performed  at [facility], [address] [place], [place] [address]  Rapid urine drug screen (hospital performed)     Status: None   Collection Time: 05/12/19 10:08 PM  Result Value Ref Range   Opiates NONE DETECTED NONE DETECTED   Cocaine NONE DETECTED NONE DETECTED   Benzodiazepines NONE DETECTED NONE DETECTED   Amphetamines NONE DETECTED NONE DETECTED   Tetrahydrocannabinol NONE DETECTED NONE DETECTED   Barbiturates NONE DETECTED NONE DETECTED  Comment: (NOTE) DRUG SCREEN FOR MEDICAL PURPOSES ONLY.  IF CONFIRMATION IS NEEDED FOR ANY PURPOSE, NOTIFY LAB WITHIN 5 DAYS. LOWEST DETECTABLE LIMITS FOR URINE DRUG SCREEN Drug Class                     Cutoff (ng/mL) Amphetamine and metabolites    1000 Barbiturate and metabolites    200 Benzodiazepine                 200 Tricyclics and metabolites     300 Opiates and metabolites        300 Cocaine and metabolites        300 THC                            50 Performed at [facility], [address] [place], [place] [address]   SARS Coronavirus 2 [facility](Hospital order, Performed in [facility] hospital lab) Nasopharyngeal Nasopharyngeal Swab     Status: None   Collection Time: 05/13/19 11:47 AM   Specimen: Nasopharyngeal Swab  Result Value Ref Range   SARS Coronavirus 2 NEGATIVE NEGATIVE    Comment: (NOTE) If result is NEGATIVE SARS-CoV-2 target nucleic acids are NOT DETECTED. The SARS-CoV-2 RNA is generally detectable in upper and lower  respiratory specimens during the acute phase of infection. The lowest  concentration of SARS-CoV-2 viral copies this assay can detect is 250  copies / mL. A negative result does not preclude SARS-CoV-2 infection  and should not be used as the sole basis for treatment or other  patient management decisions.  A negative result may occur with  improper specimen collection / handling, submission of specimen other  than nasopharyngeal swab, presence of viral mutation(s)  within the  areas targeted by this assay, and inadequate number of viral copies  (<250 copies / mL). A negative result must be combined with clinical  observations, patient history, and epidemiological information. If result is POSITIVE SARS-CoV-2 target nucleic acids are DETECTED. The SARS-CoV-2 RNA is generally detectable in upper and lower  respiratory specimens dur ing the acute phase of infection.  Positive  results are indicative of active infection with SARS-CoV-2.  Clinical  correlation with patient history and other diagnostic information is  necessary to determine patient infection status.  Positive results do  not rule out bacterial infection or co-infection with other viruses. If result is PRESUMPTIVE POSTIVE SARS-CoV-2 nucleic acids MAY BE PRESENT.   A presumptive positive result was obtained on the submitted specimen  and confirmed on repeat testing.  While 2019 novel coronavirus  (SARS-CoV-2) nucleic acids may be present in the submitted sample  additional confirmatory testing may be necessary for epidemiological  and / or clinical management purposes  to differentiate between  SARS-CoV-2 and other Sarbecovirus currently known to infect humans.  If clinically indicated additional testing with an alternate test  methodology [phone]([id number]) is advised. The SARS-CoV-2 RNA is generally  detectable in upper and lower respiratory sp ecimens during the acute  phase of infection. The expected result is Negative. Fact Sheet for Patients:  BoilerBrush.com.cyhttps://www.fda.gov/media/136312/download Fact Sheet for Healthcare Providers: https://pope.com/https://www.fda.gov/media/136313/download This test is not yet approved or cleared by the [place] FDA and has been authorized for detection and/or diagnosis of SARS-CoV-2 by FDA under an Emergency Use Authorization (EUA).  This EUA will remain in effect (meaning this test can be used) for the duration of the COVID-19 declaration under Section 564(b)(1) of the  Act,  21 U.S.C. section 360bbb-3(b)(1), unless the authorization is terminated or revoked sooner. Performed at [facility], [address] [place], [place] [address]     Blood Alcohol level:  Lab Results  Component Value Date   ETH <10 05/12/2019    Metabolic Disorder Labs:  No results found for: HGBA1C, MPG No results found for: PROLACTIN No results found for: CHOL, TRIG, HDL, CHOLHDL, VLDL, LDLCALC  Current Medications: No current facility-administered medications for this encounter.    PTA Medications: Medications Prior to Admission  Medication Sig Dispense Refill Last Dose  . amLODipine (NORVASC) 10 MG tablet Take 10 mg by mouth daily.     Marland Kitchen. PROAIR HFA 108 (90 Base) MCG/ACT inhaler Inhale 1 puff into the lungs every 6 (six) hours as needed for wheezing.       Musculoskeletal: Strength & Muscle Tone: within normal limits Gait & Station: normal Patient leans: N/A  Psychiatric Specialty Exam: Physical Exam  Constitutional: He is oriented to person, place, and time. He appears well-developed and well-nourished.  HENT:  Head: Normocephalic.  Respiratory: Effort normal.  Musculoskeletal: Normal range of motion.  Neurological: He is alert and oriented to person, place, and time.  Psychiatric: His speech is normal and behavior is normal. Judgment normal. His mood appears anxious. Thought content is paranoid. Cognition and memory are impaired. He expresses suicidal ideation.    ROS  There were no vitals taken for this visit.There is no height or weight on file to calculate BMI.  General Appearance: Disheveled  Eye Contact:  Good  Speech:  Clear and Coherent  Volume:  Normal  Mood:  Anxious  Affect:  Congruent  Thought Process:  Disorganized  Orientation:  Full (Time, Place, and Person)  Thought Content:  Illogical, Delusions, Ideas of Reference:   Paranoia and Tangential  Suicidal Thoughts:  Yes.  without intent/plan  Homicidal Thoughts:  No  Memory:   Immediate;   Fair Recent;   Fair Remote;   Fair  Judgement:  Impaired  Insight:  Shallow  Psychomotor Activity:  Normal  Concentration:  Concentration: Fair and Attention Span: Fair  Recall:  FiservFair  Fund of Knowledge:  Fair  Language:  Good  Akathisia:  Negative  Handed:  Right  AIMS (if indicated):     Assets:  ArchitectCommunication Skills Financial Resources/Insurance Housing Social Support Vocational/Educational  ADL's:  Intact  Cognition:  WNL  Sleep:   Poor      Treatment Plan Summary: Daily contact with patient to assess and evaluate symptoms and progress in treatment, Medication management and Plan [facility] for 24 hour observation and safety   Medications:  Vistaril 25 mg TID PRN for anxiety Trazodone 50 mg QHS PRN for sleep   Observation Level/Precautions:  15 minute checks Laboratory:  [facility], reviewed and addressed Psychotherapy:  TBD Medications:  Vistaril 25 mg TID PRN for anxiety Trazodone 50 mg QHS PRN for sleep  Haldol 5 mg PO QHS for psychosis and sleep Discharge Concerns:  Safety Estimated LOS:24-48 hours     Laveda AbbeLaurie Britton Parks, NP 8/7/20205:37 PM  Patient seen face-to-face for psychiatric evaluation, chart reviewed and case discussed with the physician extender and developed treatment plan. Reviewed the information documented and agree with the treatment plan. Thedore MinsMojeed Alric Geise, MD

## 2019-05-13 NOTE — ED Notes (Signed)
Out of room and walking toward the exit, states he is ready to go. Did not follow verbal direction to return to his room. Two officers detained him, Probation officer called Dr for orders for emergency meds to help him relax and to gain control. He was escorted back to room and officers present, but patient took meds with little resistance but did ask if the meds were real. Geodon 20 mg and Ativan 2 mg given as ordered.
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
## 2019-05-13 NOTE — ED Notes (Signed)
Security and officer in room next to his to give emergency medications to another patient so all staff focused on another patient when Everette walked toward the exit and was approx 7 feet from door when security officers saw him and asked him to return to his room. He paused for a few seconds before following verbal directions from the staff.He asked where was a PS4, he wanted to play games. When told we didn't have anything here like that he couldn't play games here he wanted to know where his clothes were. Told they were locked up and he couldn't have them now, her returned to his room. He has a focused, staring look that to this writer is uncomfortable to experience. Sitter at doorway for his safety.

## 2019-05-13 NOTE — ED Notes (Addendum)
Report given to Butch Penny RN on Obs Unit at [facility]. Pt informed. [employer] called.

## 2019-05-13 NOTE — ED Notes (Signed)
Called report to [facility]. They are busy at this moment and could not take report.

## 2019-05-14 ENCOUNTER — Encounter (HOSPITAL_COMMUNITY): Payer: Self-pay | Admitting: *Deleted

## 2019-05-14 DIAGNOSIS — F23 Brief psychotic disorder: Secondary | ICD-10-CM

## 2019-05-14 DIAGNOSIS — F4323 Adjustment disorder with mixed anxiety and depressed mood: Secondary | ICD-10-CM | POA: Diagnosis not present

## 2019-05-14 MED ORDER — ZIPRASIDONE MESYLATE 20 MG IM SOLR
20.0000 mg | INTRAMUSCULAR | Status: AC | PRN
  Administered 2019-05-14: 20 mg via INTRAMUSCULAR
  Filled 2019-05-14: qty 20

## 2019-05-14 MED ORDER — HALOPERIDOL 2 MG PO TABS: 2.0000 mg | ORAL_TABLET | Freq: Two times a day (BID) | ORAL | 0 refills | Status: DC

## 2019-05-14 MED ORDER — OLANZAPINE 5 MG PO TBDP
5.0000 mg | ORAL_TABLET | Freq: Three times a day (TID) | ORAL | Status: DC | PRN
  Administered 2019-05-14: 01:00:00 5 mg via ORAL
  Filled 2019-05-14: qty 1

## 2019-05-14 MED ORDER — HYDROXYZINE HCL 25 MG PO TABS: 25.0000 mg | ORAL_TABLET | Freq: Three times a day (TID) | ORAL | 0 refills | Status: DC | PRN

## 2019-05-14 MED ORDER — TRAZODONE HCL 50 MG PO TABS: 50.0000 mg | ORAL_TABLET | Freq: Every evening | ORAL | 0 refills | Status: DC | PRN

## 2019-05-14 MED ORDER — FLUOXETINE HCL 10 MG PO CAPS: 10.0000 mg | ORAL_CAPSULE | Freq: Every day | ORAL | 0 refills | Status: DC

## 2019-05-14 MED ORDER — LORAZEPAM 1 MG PO TABS
1.0000 mg | ORAL_TABLET | ORAL | Status: AC | PRN
  Administered 2019-05-14: 1 mg via ORAL
  Filled 2019-05-14 (×2): qty 1

## 2019-05-14 NOTE — Progress Notes (Addendum)
Racing around room, "I need my shoes, just pull the truck up and throw me in the back, its time to get out of this town." encouraged Ativan PO once more, took medication willingly "so we can be ready and go." water given.

## 2019-05-14 NOTE — Progress Notes (Addendum)
Fell asleep at 0330, in bed, eyes closed, snoring and changing positions as needed. No distress. Respirations even and non labored. Pt is safe.

## 2019-05-14 NOTE — Progress Notes (Signed)
In bed, eyes closed, appears calm, talking to self quietly. Up to the restroom and back to bed. Asked a few times " can I leave right now." support provided and encouraged to sleep. Quiet in bed.

## 2019-05-14 NOTE — Progress Notes (Signed)
Patient ID: Fernando Ferguson, male   DOB: Oct 30, 1960, 58 y.o.   MRN: [id number] Patient discharged to home/self care in on his own accord.  Patient denies SI, HI and AVH upon discharge.  Patient acknowledged understanding of all discharge instructions and receipt of personal belongings.

## 2019-05-14 NOTE — Progress Notes (Signed)
Agitated, wanting to leave. reporting people are "coming in my window and out to get me." reports " I know everyone is lying to me." support provided, Zyprexa PO given.

## 2019-05-14 NOTE — Discharge Summary (Addendum)
[facility]  05/14/2019 12:32 PM Fernando Ferguson  MRN:  [id number] Principal Problem: Adjustment disorder with mixed anxiety and depressed mood Discharge Diagnoses: Principal Problem:   Adjustment disorder with mixed anxiety and depressed mood Active Problems:   Brief psychotic disorder (HCC)   MDD (major depressive disorder), recurrent severe, without psychosis (HCC)   Subjective: Fernando PilotEverette Pro, 58 y.o., male patient seen face to face by this provider, Dr. Jannifer FranklinAkintayo; and chart reviewed on 05/14/19.  On evaluation Fernando Ferguson reports he is feeling pretty good and that he slept well last night.  Patient denies suicidal/self-harm/homicidal ideation, psychosis, and, paranoia.  Patient states that he lives with his mother and aunt who are supportive.  States that he never said he wanted to kill himself and that he is ready to go home.   During evaluation Fernando Ferguson is alert/oriented x 4; calm/cooperative; and mood is congruent with affect.  He does not appear to be responding to internal/external stimuli or delusional thoughts.  Patient denies suicidal/self-harm/homicidal ideation, psychosis, and paranoia.  Patient answered question appropriately and there had been no outburst verbal or psychical during patient 24 hour observation.  Tolerated medications without adverse reaction   Total Time spent with patient: 30 minutes  Past Psychiatric History: Denies  Past Medical History:  Past Medical History:  Diagnosis Date  . Asthma   . Hypertension   . Medical history non-contributory    History reviewed. No pertinent surgical history. Family History: History reviewed. No pertinent family history. Family Psychiatric  History: Unaware Social History:  Social History   Substance and Sexual Activity  Alcohol Use Never  . Frequency: Never     Social History   Substance and Sexual Activity  Drug Use Never    Social History   Socioeconomic History  . Marital status:  Unknown    Spouse name: Not on file  . Number of children: Not on file  . Years of education: Not on file  . Highest education level: Not on file  Occupational History  . Not on file  Social Needs  . Financial resource strain: Not on file  . Food insecurity    Worry: Not on file    Inability: Not on file  . Transportation needs    Medical: Not on file    Non-medical: Not on file  Tobacco Use  . Smoking status: Current Every Day Smoker    Packs/day: 1.00    Types: Cigarettes  . Smokeless tobacco: Never Used  Substance and Sexual Activity  . Alcohol use: Never    Frequency: Never  . Drug use: Never  . Sexual activity: Not Currently  Lifestyle  . Physical activity    Days per week: Not on file    Minutes per session: Not on file  . Stress: Not on file  Relationships  . Social Musicianconnections    Talks on phone: Not on file    Gets together: Not on file    Attends religious service: Not on file    Active member of club or organization: Not on file    Attends meetings of clubs or organizations: Not on file    Relationship status: Not on file  Other Topics Concern  . Not on file  Social History Narrative  . Not on file    Has this patient used any form of tobacco in the last 30 days? (Cigarettes, Smokeless Tobacco, Cigars, and/or Pipes) A prescription for an FDA-approved tobacco cessation medication was offered at discharge and  the patient refused  Current Medications: Current Facility-Administered Medications  Medication Dose Route Frequency Provider Last Rate Last Dose  . acetaminophen (TYLENOL) tablet 650 mg  650 mg Oral Q6H PRN Laveda AbbeParks, Laurie Britton, NP      . alum & mag hydroxide-simeth (MAALOX/MYLANTA) 200-200-20 MG/5ML suspension 30 mL  30 mL Oral Q4H PRN Laveda AbbeParks, Laurie Britton, NP      . FLUoxetine (PROZAC) capsule 10 mg  10 mg Oral Daily Laveda AbbeParks, Laurie Britton, NP      . haloperidol (HALDOL) tablet 2 mg  2 mg Oral BID Laveda AbbeParks, Laurie Britton, NP   2 mg at 05/13/19 1919   . hydrOXYzine (ATARAX/VISTARIL) tablet 25 mg  25 mg Oral TID PRN Laveda AbbeParks, Laurie Britton, NP   25 mg at 05/13/19 1919  . ibuprofen (ADVIL) tablet 600 mg  600 mg Oral Q8H PRN Laveda AbbeParks, Laurie Britton, NP      . magnesium hydroxide (MILK OF MAGNESIA) suspension 30 mL  30 mL Oral Daily PRN Laveda AbbeParks, Laurie Britton, NP      . OLANZapine zydis (ZYPREXA) disintegrating tablet 5 mg  5 mg Oral Q8H PRN Donell SievertSimon, Spencer E, PA-C   5 mg at 05/14/19 0043  . traZODone (DESYREL) tablet 50 mg  50 mg Oral QHS PRN Laveda AbbeParks, Laurie Britton, NP   50 mg at 05/13/19 2305   PTA Medications: Medications Prior to Admission  Medication Sig Dispense Refill Last Dose  . amLODipine (NORVASC) 10 MG tablet Take 10 mg by mouth daily.     Marland Kitchen. PROAIR HFA 108 (90 Base) MCG/ACT inhaler Inhale 1 puff into the lungs every 6 (six) hours as needed for wheezing.       Musculoskeletal: Strength & Muscle Tone: within normal limits Gait & Station: normal Patient leans: N/A  Psychiatric Specialty Exam: Physical Exam  Nursing note and vitals reviewed. Constitutional: He is oriented to person, place, and time. He appears well-developed and well-nourished. No distress.  Neck: Normal range of motion.  Respiratory: Effort normal.  Musculoskeletal: Normal range of motion.  Neurological: He is alert and oriented to person, place, and time.  Skin: Skin is warm and dry.  Psychiatric: His speech is normal and behavior is normal. Thought content normal. Cognition and memory are normal. Depressed: Stable.    Review of Systems  Psychiatric/Behavioral: Depression: Stable. Hallucinations: Denies. Substance abuse: Denies. Suicidal ideas: Denies. Nervous/anxious: Denies. Insomnia: Denies.   All other systems reviewed and are negative.   Blood pressure (!) 131/100, pulse (!) 112, temperature 98.2 F (36.8 C), temperature source Oral, resp. rate 18, SpO2 99 %.There is no height or weight on file to calculate BMI.  General Appearance: Casual; patient in  burgundy paper scrubs   Eye Contact:  Good  Speech:  Clear and Coherent and Normal Rate  Volume:  Normal  Mood:  Appropriate  Affect:  Appropriate and Congruent  Thought Process:  Coherent and Goal Directed  Orientation:  Full (Time, Place, and Person)  Thought Content:  WDL; Patient denies hallucinations, delusions, and paranoia.   Suicidal Thoughts:  No  Homicidal Thoughts:  No  Memory:  Immediate;   Good Recent;   Good  Judgement:  Intact  Insight:  Present  Psychomotor Activity:  Normal  Concentration:  Concentration: Good and Attention Span: Good  Recall:  Good  Fund of Knowledge:  Fair  Language:  Good  Akathisia:  No  Handed:  Right  AIMS (if indicated):     Assets:  Communication Skills Desire for Improvement Housing Social  Support  ADL's:  Intact  Cognition:  WNL  Sleep:        Demographic Factors:  Male  Loss Factors: Job loss, divorce  Historical Factors: NA  Risk Reduction Factors:   Sense of responsibility to family, Living with another person, especially a relative and Positive social support  Continued Clinical Symptoms:  Previous Psychiatric Diagnoses and Treatments  Cognitive Features That Contribute To Risk:  None    Suicide Risk:  Minimal: No identifiable suicidal ideation.  Patients presenting with no risk factors but with morbid ruminations; may be classified as minimal risk based on the severity of the depressive symptoms    Plan Of Care/Follow-up recommendations:  Activity:  As tolerated Diet:  Heart healthy Other:  Follow up with resources given for outpatient psychiatric services   Disposition: No evidence of imminent risk to self or others at present.   Patient does not meet criteria for psychiatric inpatient admission. Supportive therapy provided about ongoing stressors. Discussed crisis plan, support from social network, calling 911, coming to the Emergency Department, and calling Suicide Hotline.  Shuvon Rankin,  NP 05/14/2019, 12:32 PM  Patient seen face-to-face for psychiatric evaluation, chart reviewed and case discussed with the physician extender and developed treatment plan. Reviewed the information documented and agree with the treatment plan. Corena Pilgrim, MD

## 2019-05-14 NOTE — Progress Notes (Signed)
Agitated, pacing, banging and pushing on hallway door to get out, " just let me out of here, I need my shoes." verbally aggressive towards staff. Ativan PO given, spit out medication. Geodon IM Given without resistance. Encouraged to sleep. Will continue to monitor.

## 2020-04-16 ENCOUNTER — Emergency Department (HOSPITAL_COMMUNITY): Payer: Self-pay

## 2020-04-16 ENCOUNTER — Encounter (HOSPITAL_COMMUNITY): Payer: Self-pay

## 2020-04-16 ENCOUNTER — Emergency Department (HOSPITAL_COMMUNITY)
Admission: EM | Admit: 2020-04-16 | Discharge: 2020-04-16 | Disposition: A | Discharge status: Home / Self Care | Payer: Self-pay | Attending: Emergency Medicine | Admitting: Emergency Medicine

## 2020-04-16 ENCOUNTER — Other Ambulatory Visit: Payer: Self-pay

## 2020-04-16 DIAGNOSIS — J45909 Unspecified asthma, uncomplicated: Secondary | ICD-10-CM | POA: Insufficient documentation

## 2020-04-16 DIAGNOSIS — F17210 Nicotine dependence, cigarettes, uncomplicated: Secondary | ICD-10-CM | POA: Insufficient documentation

## 2020-04-16 DIAGNOSIS — R262 Difficulty in walking, not elsewhere classified: Secondary | ICD-10-CM | POA: Insufficient documentation

## 2020-04-16 DIAGNOSIS — Z79899 Other long term (current) drug therapy: Secondary | ICD-10-CM | POA: Insufficient documentation

## 2020-04-16 DIAGNOSIS — R002 Palpitations: Secondary | ICD-10-CM | POA: Insufficient documentation

## 2020-04-16 DIAGNOSIS — R202 Paresthesia of skin: Secondary | ICD-10-CM | POA: Insufficient documentation

## 2020-04-16 DIAGNOSIS — R531 Weakness: Secondary | ICD-10-CM | POA: Insufficient documentation

## 2020-04-16 LAB — BASIC METABOLIC PANEL
Anion gap: 10 (ref 5–15)
BUN: 12 mg/dL (ref 6–20)
CO2: 21 mmol/L — ABNORMAL LOW (ref 22–32)
Calcium: 9.1 mg/dL (ref 8.9–10.3)
Chloride: 106 mmol/L (ref 98–111)
Creatinine, Ser: 0.93 mg/dL (ref 0.61–1.24)
GFR calc Af Amer: >60 mL/min (ref >60)
GFR calc non Af Amer: >60 mL/min (ref >60)
Glucose, Bld: 98 mg/dL (ref 70–99)
Potassium: 4.4 mmol/L (ref 3.5–5.1)
Sodium: 137 mmol/L (ref 135–145)

## 2020-04-16 LAB — URINALYSIS, ROUTINE W REFLEX MICROSCOPIC
Bilirubin Urine: NEGATIVE
Glucose, UA: NEGATIVE mg/dL
Hgb urine dipstick: NEGATIVE
Ketones, ur: NEGATIVE mg/dL
Nitrite: NEGATIVE
Protein, ur: NEGATIVE mg/dL
Specific Gravity, Urine: >1.03 — ABNORMAL HIGH (ref 1.005–1.030)
pH: 5 (ref 5.0–8.0)

## 2020-04-16 LAB — CBC
HCT: 45 % (ref 39.0–52.0)
Hemoglobin: 15 g/dL (ref 13.0–17.0)
MCH: 30.2 pg (ref 26.0–34.0)
MCHC: 33.3 g/dL (ref 30.0–36.0)
MCV: 90.5 fL (ref 80.0–100.0)
Platelets: 233 10*3/uL (ref 150–400)
RBC: 4.97 MIL/uL (ref 4.22–5.81)
RDW: 15.5 % (ref 11.5–15.5)
WBC: 6.4 10*3/uL (ref 4.0–10.5)
nRBC: 0 % (ref 0.0–0.2)

## 2020-04-16 LAB — URINALYSIS, MICROSCOPIC (REFLEX)

## 2020-04-16 LAB — CBG MONITORING, ED: Glucose-Capillary: 87 mg/dL (ref 70–99)

## 2020-04-16 MED ORDER — GADOBUTROL 1 MMOL/ML IV SOLN
8.0000 mL | Freq: Once | INTRAVENOUS | Status: AC | PRN
  Administered 2020-04-16: 8 mL via INTRAVENOUS

## 2020-04-16 MED ORDER — SODIUM CHLORIDE 0.9% FLUSH: 3.0000 mL | Freq: Once | INTRAVENOUS | Status: DC

## 2020-04-16 NOTE — ED Triage Notes (Signed)
Pt presents with numbness to left side of his body, states he has been seen at [facility] for numbness to the Right side of his body x2 weeks. Pt states it has now moved over to the left side

## 2020-04-16 NOTE — ED Provider Notes (Signed)
[facility] EMERGENCY DEPARTMENT Provider Note   CSN: [id number] Arrival date & time: 04/16/20  1158     History Chief Complaint  Patient presents with  . Numbness    Fernando Ferguson is a 59 y.o. male w pmhx significant for htn who presents with waxing and waning sensation changes x2 weeks. Patient states he has had sensation changes to full LLE and LUE that come and go, however last night he experienced transient episode of decreased sensation to RLE/RUE and mouth. Pt stated due to episode he was unable to ambulate. Pt endorses normal R extremity sensation at presentation but endorses continued "numbness" to upper lip/teeth. Pt endorses intermittent problems with ambulation. Denies HA, vision changes, CP, SOB, fever, URI symptoms, rash. Pt denies alcohol, substance use. Pt denies new medications.       Past Medical History:  Diagnosis Date  . Asthma   . Hypertension   . Medical history non-contributory     Patient Active Problem List   Diagnosis Date Noted  . Adjustment disorder with mixed anxiety and depressed mood 05/14/2019  . Major depressive disorder, recurrent, severe with psychotic features (HCC) 05/13/2019  . Brief psychotic disorder (HCC) 05/13/2019  . MDD (major depressive disorder), recurrent severe, without psychosis (HCC) 05/13/2019    No past surgical history on file.     No family history on file.  Social History   Tobacco Use  . Smoking status: Current Every Day Smoker    Packs/day: 1.00    Types: Cigarettes  . Smokeless tobacco: Never Used  Vaping Use  . Vaping Use: Never used  Substance Use Topics  . Alcohol use: Never  . Drug use: Never    Home Medications Prior to Admission medications   Medication Sig Start Date End Date Taking? Authorizing Provider  amLODipine (NORVASC) 10 MG tablet Take 10 mg by mouth daily. 04/04/19   [provider]  FLUoxetine (PROZAC) 10 MG capsule Take 1 capsule (10 mg total) by mouth  daily. 05/15/19   Rankin, Shuvon B, NP  haloperidol (HALDOL) 2 MG tablet Take 1 tablet (2 mg total) by mouth 2 (two) times daily. 05/14/19   Rankin, Shuvon B, NP  hydrOXYzine (ATARAX/VISTARIL) 25 MG tablet Take 1 tablet (25 mg total) by mouth 3 (three) times daily as needed for anxiety. 05/14/19   Rankin, Shuvon B, NP  PROAIR HFA 108 (90 Base) MCG/ACT inhaler Inhale 1 puff into the lungs every 6 (six) hours as needed for wheezing. 12/24/18   [provider]  traZODone (DESYREL) 50 MG tablet Take 1 tablet (50 mg total) by mouth at bedtime as needed for sleep. 05/14/19   Rankin, Shuvon B, NP    Allergies    Ciprofloxacin hcl  Review of Systems   Review of Systems  Constitutional: Negative for chills and fever.  HENT: Negative for congestion, rhinorrhea and sore throat.   Eyes: Negative for photophobia and visual disturbance.  Respiratory: Negative for cough and shortness of breath.   Cardiovascular: Negative for chest pain and leg swelling.  Gastrointestinal: Negative for abdominal pain, nausea and vomiting.  Genitourinary: Negative for dysuria and hematuria.  Musculoskeletal: Positive for gait problem. Negative for back pain and neck pain.  Skin: Negative for rash and wound.  Neurological: Positive for weakness, light-headedness, numbness and headaches. Negative for syncope and facial asymmetry.    Physical Exam Updated Vital Signs BP 123/88 (BP Location: Right Arm)   Pulse 74   Temp 98.2 F (36.8 C) (Oral)  Resp 20   SpO2 98%   Physical Exam Vitals and nursing note reviewed.  Constitutional:      General: He is not in acute distress.    Appearance: Normal appearance. He is not ill-appearing, toxic-appearing or diaphoretic.  HENT:     Head: Normocephalic and atraumatic.     Mouth/Throat:     Mouth: Mucous membranes are moist.  Eyes:     General: No scleral icterus.    Extraocular Movements: Extraocular movements intact.     Conjunctiva/sclera: Conjunctivae normal.      Pupils: Pupils are equal, round, and reactive to light.  Cardiovascular:     Rate and Rhythm: Normal rate and regular rhythm.     Heart sounds: Normal heart sounds. No murmur heard.   Pulmonary:     Effort: No respiratory distress.     Breath sounds: Normal breath sounds. No wheezing.  Abdominal:     General: Abdomen is flat.     Palpations: Abdomen is soft.     Tenderness: There is no abdominal tenderness. There is no guarding or rebound.  Musculoskeletal:        General: No tenderness or signs of injury.     Right lower leg: No edema.     Left lower leg: No edema.  Skin:    General: Skin is warm.     Findings: No rash.  Neurological:     General: No focal deficit present.     Mental Status: He is alert and oriented to person, place, and time. Mental status is at baseline.     Cranial Nerves: No cranial nerve deficit.     Sensory: No sensory deficit.     Motor: No weakness.     Coordination: Coordination normal.     Comments: Patient endorsed tingling sensation to tips of left fingers.     ED Results / Procedures / Treatments   Labs (all labs ordered are listed, but only abnormal results are displayed) Labs Reviewed  BASIC METABOLIC PANEL - Abnormal; Notable for the following components:      Result Value   CO2 21 (*)    All other components within normal limits  URINALYSIS, ROUTINE W REFLEX MICROSCOPIC - Abnormal; Notable for the following components:   Specific Gravity, Urine >1.030 (*)    Leukocytes,Ua TRACE (*)    All other components within normal limits  URINALYSIS, MICROSCOPIC (REFLEX) - Abnormal; Notable for the following components:   Bacteria, UA FEW (*)    All other components within normal limits  CBC  CBG MONITORING, ED    EKG None  Radiology CT HEAD WO CONTRAST  Result Date: 04/16/2020 CLINICAL DATA:  Left-sided numbness.  Recent right-sided numbness. EXAM: CT HEAD WITHOUT CONTRAST TECHNIQUE: Contiguous axial images were obtained from the base of  the skull through the vertex without intravenous contrast. COMPARISON:  None. FINDINGS: Brain: Bilateral hypodensity within the anterior pons (series 3, image 8; series 6, image 34). Otherwise, no evidence of acute infarction, hemorrhage, hydrocephalus, extra-axial collection or mass lesion/mass effect. Vascular: Atherosclerotic vascular calcification of the carotid siphons. No hyperdense vessel. Skull: Normal. Negative for fracture or focal lesion. Sinuses/Orbits: Partial opacification of the ethmoid air cells and left frontal sinus. Remaining paranasal sinuses and mastoid air cells are clear. The orbits are unremarkable. Other: None. IMPRESSION: 1. Bilateral hypodensity within the anterior pons. This is usually artifactual, but given the patient's symptoms, MRI of the brain is recommended to exclude infarct. Electronically Signed   By: Teresita Madura  Derry M.D.   On: 04/16/2020 14:03    Procedures Procedures (including critical care time)  Medications Ordered in ED Medications  sodium chloride flush (NS) 0.9 % injection 3 mL (has no administration in time range)    ED Course  I have reviewed the triage vital signs and the nursing notes.  Pertinent labs & imaging results that were available during my care of the patient were reviewed by me and considered in my medical decision making (see chart for details).    MDM Rules/Calculators/A&P                          Pt is a 59 yo M w PMHx significant only for htn who presents with waxing and waning sensation changes and trouble w ambulation. Pt endorses symptoms intermittently present for last 2 weeks to LUE/LLE. Pt endorses new transient episode of RLE/RUE sensation changes/weakness last night that has resolved. Endorses lip/teeth numbness that persists. No hx CVA. No new medications, fever, uri symptoms, HA, vision changes, mental status changes, trauma, CP, SOB. Denies alcohol, substance use. On arrival pt afebrile, HDS, NAD. Pt alert and oriented. CN  II-XII grossly intact. Intact strength/sensation in all extremities. Pt endorses tingling/numbness to tips of L fingers. No overyling skin changes/rash. No focal findings on exam. Patient overall well appearing. Initial labs completed in triage reassuring. No abnormalities on BMP, BG 98. No leukocytosis, anemia on CBC. UA wo evidence of infection. CTH was concerning for hypodensity with anterior pons that could be artifact vs. Infarct. MRI brain was completed for further evaluation and was not significant for abnormal findings consistent with infarct. Imaging helps r/o stroke as source of symptoms. No other concerning findings exam, work up that are concerning for emergent cause of symptoms. Recommend outpatient follow up with neurology. Patient/family in agreement with plan. Amb referral placed and information for neurology provided in written format in AVS. Patient discharged in stable condition.    Final Clinical Impression(s) / ED Diagnoses Final diagnoses:  None    Rx / DC Orders ED Discharge Orders    None       Golden Pop, MD 04/17/20 1103    Cathren Laine, MD 04/17/20 1826

## 2020-04-16 NOTE — ED Provider Notes (Signed)
Patient placed in Quick Look pathway, seen and evaluated   Chief Complaint: Left sided numbness and weakness  HPI:   Patient with left sided numbness and weakness. SXS began sat morning when he awoke with weakness and discoordination in the left lag and arm. This morning he awoke with numbness. No speech difficulty, neglect, facial droop. LVO neg.  + smoking.   ROS: + numbness and weakness. - palpitations (one)  Physical Exam:   Gen: No distress  Neuro: Awake and Alert  Skin: Warm    Focused Exam: abnormal sensation on the left   Initiation of care has begun. The patient has been counseled on the process, plan, and necessity for staying for the completion/evaluation, and the remainder of the medical screening examination    Arthor Captain, Cordelia Poche 04/16/20 1216    Bethann Berkshire, MD 04/16/20 1547

## 2020-04-17 ENCOUNTER — Encounter: Payer: Self-pay | Admitting: Neurology

## 2020-05-02 ENCOUNTER — Other Ambulatory Visit: Payer: Self-pay | Admitting: Family Medicine

## 2020-05-02 ENCOUNTER — Ambulatory Visit
Admission: RE | Admit: 2020-05-02 | Discharge: 2020-05-02 | Disposition: A | Discharge status: Home / Self Care | Payer: Self-pay | Source: Ambulatory Visit | Attending: Family Medicine | Admitting: Family Medicine

## 2020-05-02 DIAGNOSIS — R202 Paresthesia of skin: Secondary | ICD-10-CM

## 2020-05-02 DIAGNOSIS — M5412 Radiculopathy, cervical region: Secondary | ICD-10-CM

## 2020-05-23 ENCOUNTER — Encounter: Payer: Self-pay | Admitting: Physical Medicine & Rehabilitation

## 2020-05-31 ENCOUNTER — Other Ambulatory Visit: Payer: Self-pay | Admitting: Family Medicine

## 2020-05-31 DIAGNOSIS — R937 Abnormal findings on diagnostic imaging of other parts of musculoskeletal system: Secondary | ICD-10-CM

## 2020-05-31 DIAGNOSIS — M5090 Cervical disc disorder, unspecified, unspecified cervical region: Secondary | ICD-10-CM

## 2020-05-31 DIAGNOSIS — G959 Disease of spinal cord, unspecified: Secondary | ICD-10-CM

## 2020-06-01 ENCOUNTER — Other Ambulatory Visit: Payer: Self-pay

## 2020-06-01 ENCOUNTER — Encounter: Payer: Self-pay | Admitting: Neurology

## 2020-06-01 ENCOUNTER — Other Ambulatory Visit (INDEPENDENT_AMBULATORY_CARE_PROVIDER_SITE_OTHER): Payer: BC Managed Care – PPO

## 2020-06-01 ENCOUNTER — Ambulatory Visit (INDEPENDENT_AMBULATORY_CARE_PROVIDER_SITE_OTHER): Payer: BC Managed Care – PPO | Admitting: Neurology

## 2020-06-01 VITALS — BP 153/92 | HR 83 | Ht 68.0 in | Wt 171.6 lb

## 2020-06-01 DIAGNOSIS — R202 Paresthesia of skin: Secondary | ICD-10-CM

## 2020-06-01 LAB — TSH: TSH: 2.22 u[IU]/mL (ref 0.35–4.50)

## 2020-06-01 LAB — VITAMIN B12: Vitamin B-12: 526 pg/mL (ref 211–911)

## 2020-06-01 NOTE — Patient Instructions (Addendum)
Nerve testing of the left arm and right leg  Check labs  ELECTROMYOGRAM AND NERVE CONDUCTION STUDIES (EMG/NCS) INSTRUCTIONS  How to Prepare The neurologist conducting the EMG will need to know if you have certain medical conditions. Tell the neurologist and other EMG lab personnel if you: . Have a pacemaker or any other electrical medical device . Take blood-thinning medications . Have hemophilia, a blood-clotting disorder that causes prolonged bleeding Bathing Take a shower or bath shortly before your exam in order to remove oils from your skin. Don't apply lotions or creams before the exam.  What to Expect You'll likely be asked to change into a hospital gown for the procedure and lie down on an examination table. The following explanations can help you understand what will happen during the exam.  . Electrodes. The neurologist or a technician places surface electrodes at various locations on your skin depending on where you're experiencing symptoms. Or the neurologist may insert needle electrodes at different sites depending on your symptoms.  . Sensations. The electrodes will at times transmit a tiny electrical current that you may feel as a twinge or spasm. The needle electrode may cause discomfort or pain that usually ends shortly after the needle is removed. If you are concerned about discomfort or pain, you may want to talk to the neurologist about taking a short break during the exam.  . Instructions. During the needle EMG, the neurologist will assess whether there is any spontaneous electrical activity when the muscle is at rest - activity that isn't present in healthy muscle tissue - and the degree of activity when you slightly contract the muscle.  He or she will give you instructions on resting and contracting a muscle at appropriate times. Depending on what muscles and nerves the neurologist is examining, he or she may ask you to change positions during the exam.  After your  EMG You may experience some temporary, minor bruising where the needle electrode was inserted into your muscle. This bruising should fade within several days. If it persists, contact your primary care doctor.

## 2020-06-01 NOTE — Progress Notes (Signed)
[facility] Neurology [facility] Clinic Note - Initial Visit   Date: 06/01/20  Fernando Ferguson MRN: [id number] DOB: [birth year]   Dear Dr. Lazarus Salines:  Thank you for your kind referral of Fernando Ferguson for consultation of paresthesias. Although his history is well known to you, please allow [place] to reiterate it for the purpose of our medical record. The patient was accompanied to the clinic by self.   History of Present Illness: Fernando Ferguson is a 59 y.o. right-handed male presenting for evaluation of numbness/tingling. Starting in July, he developed relatively acute onset of tingling of the left hand and forearm, lower legs and feet, and teeth. On one occasion, he was unable to stand up which made him concerned, but he did not seek medical care until the following day. He wen to the ER where  MRI brain was normal. He continues to intermitting numbness of the mouth and left hand tingling.  Symptoms sometimes wake him up from sleeping. Cervical XR shows multilevel cervical spondylosis. is PCP has ordered MRI cervical spine.  He denies neck pain, but complains of tingling involving both arms, worse on the left.  He continues to have numbness/tingling of the mouth and soles of the feet (worse on the right).  No imbalance or falls.   He works is a Control and instrumentation engineer.   Out-side paper records, electronic medical record, and images have been reviewed where available and summarized as:  MRI brain wwo contrast 04/16/2020:  Normal brain MRI.  No acute intracranial abnormality identified.  Past Medical History:  Diagnosis Date  . Asthma   . Hypertension   . Medical history non-contributory     History reviewed. No pertinent surgical history.   Medications:  Outpatient Encounter Medications as of 06/01/2020  Medication Sig  . amLODipine (NORVASC) 10 MG tablet Take 10 mg by mouth daily.  Marland Kitchen FLUoxetine (PROZAC) 10 MG capsule Take 1 capsule (10 mg total) by mouth daily. (Patient not taking: Reported on  06/01/2020)  . haloperidol (HALDOL) 2 MG tablet Take 1 tablet (2 mg total) by mouth 2 (two) times daily. (Patient not taking: Reported on 06/01/2020)  . hydrOXYzine (ATARAX/VISTARIL) 25 MG tablet Take 1 tablet (25 mg total) by mouth 3 (three) times daily as needed for anxiety. (Patient not taking: Reported on 06/01/2020)  . traZODone (DESYREL) 50 MG tablet Take 1 tablet (50 mg total) by mouth at bedtime as needed for sleep. (Patient not taking: Reported on 06/01/2020)   No facility-administered encounter medications on file as of 06/01/2020.    Allergies:  Allergies  Allergen Reactions  . Ciprofloxacin Hcl Swelling    Facial swelling     Family History: No family history of neuropathy.   Social History: Social History   Tobacco Use  . Smoking status: Current Every Day Smoker    Packs/day: 1.00    Types: Cigarettes  . Smokeless tobacco: Never Used  Vaping Use  . Vaping Use: Never used  Substance Use Topics  . Alcohol use: Never  . Drug use: Never   Social History   Social History Narrative   Right handed   Lives with a roommate     Vital Signs:  BP (!) 153/92 (BP Location: Left Arm, Patient Position: Sitting, Cuff Size: Small)   Pulse 83   Ht 5\' 8"  (1.727 m)   Wt 171 lb 9.6 oz (77.8 kg)   SpO2 98%   BMI 26.09 kg/m    Neurological Exam: MENTAL STATUS including orientation to time, place, person, recent and  remote memory, attention span and concentration, language, and fund of knowledge is normal.  Speech is not dysarthric.  CRANIAL NERVES: II:  No visual field defects.    III-IV-VI: Pupils equal round and reactive to light.  Normal conjugate, extra-ocular eye movements in all directions of gaze.  No nystagmus.  No ptosis.   V:  Normal facial sensation.    VII:  Normal facial movements.   VIII:  Normal hearing and vestibular function.   IX-X:  Normal palatal movement.   XI:  Normal shoulder shrug and head rotation.   XII:  Normal tongue strength and range of  motion, no deviation or fasciculation.  MOTOR:  No atrophy, fasciculations or abnormal movements.  No pronator drift.   Upper Extremity:  Right  Left  Deltoid  5/5   5/5   Biceps  5/5   5/5   Triceps  5/5   5/5   Infraspinatus 5/5  5/5  Medial pectoralis 5/5  5/5  Wrist extensors  5/5   5/5   Wrist flexors  5/5   5/5   Finger extensors  5/5   5/5   Finger flexors  5/5   5/5   Dorsal interossei  5/5   5/5   Abductor pollicis  5/5   5/5   Tone (Ashworth scale)  0  0   Lower Extremity:  Right  Left  Hip flexors  5/5   5/5   Hip extensors  5/5   5/5   Adductor 5/5  5/5  Abductor 5/5  5/5  Knee flexors  5/5   5/5   Knee extensors  5/5   5/5   Dorsiflexors  5/5   5/5   Plantarflexors  5/5   5/5   Toe extensors  5/5   5/5   Toe flexors  5/5   5/5   Tone (Ashworth scale)  0  0   MSRs:  Right        Left                  brachioradialis 2+  2+  biceps 2+  2+  triceps 2+  2+  patellar 2+  2+  ankle jerk 2+  2+  Hoffman no  no  plantar response down  down   SENSORY:  Normal and symmetric perception of light touch, pinprick, vibration, and proprioception.  Romberg's sign absent.   COORDINATION/GAIT: Normal finger-to- nose-finger and heel-to-shin.  Intact rapid alternating movements bilaterally.  Able to rise from a chair without using arms.  Gait narrow based and stable. Tandem and stressed gait intact.    IMPRESSION: Paresthesias involving the hands, feet, and mouth.  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  With his migratory paresthesias in the face and feet, these are more nonspecific.  MRI brain being normal excludes demyelinating pathology.   PLAN/RECOMMENDATIONS:  NCS/EMG of the left arm and right leg Check TSH and vitamin B12 MRI cervical spine has been ordered through PCP's office  Further recommendations pending results.   Thank you for allowing me to participate in patient's care.  If I can answer any additional questions, I would be  pleased to do so.    Sincerely,    Rivky Clendenning K. Allena Katz, DO

## 2020-06-12 ENCOUNTER — Encounter
Payer: BC Managed Care – PPO | Attending: Physical Medicine & Rehabilitation | Admitting: Physical Medicine & Rehabilitation

## 2020-06-12 ENCOUNTER — Other Ambulatory Visit: Payer: Self-pay

## 2020-06-12 ENCOUNTER — Encounter: Payer: Self-pay | Admitting: Physical Medicine & Rehabilitation

## 2020-06-12 VITALS — BP 135/88 | HR 90 | Temp 98.3°F | Ht 68.0 in | Wt 171.2 lb

## 2020-06-12 DIAGNOSIS — M79609 Pain in unspecified limb: Secondary | ICD-10-CM | POA: Diagnosis present

## 2020-06-12 DIAGNOSIS — R202 Paresthesia of skin: Secondary | ICD-10-CM | POA: Diagnosis present

## 2020-06-12 DIAGNOSIS — G459 Transient cerebral ischemic attack, unspecified: Secondary | ICD-10-CM | POA: Insufficient documentation

## 2020-06-12 NOTE — Patient Instructions (Signed)
Still in diagnostic phase  Would recommend Carotid dopplers to eval blood flow through neck arteries  Agree with MRI neck and EMG /Nerve test

## 2020-06-12 NOTE — Progress Notes (Signed)
Subjective:    Patient ID: Fernando Ferguson, male    DOB: 07/02/1961, 59 y.o.   MRN: [id number]  HPI Chief complaint L>R hand tingling and numbness as well as intermittent right-sided upper extremity lower extremity and facial numbness  59 year old male with history of hypertension as well as tobacco use indicates that over the last year he has had 2 different symptoms.  First he has fairly constant left hand weakness which worsens at night and requires him to shake his hand out.  He has some mild neck pain.  No prior history of carpal tunnel syndrome.  No upper extremity surgery history no cervical spine surgery In addition patient has episodes of right facial arm and leg numbness that last up to 15 minutes that occur on some days.  Yesterday the patient states he had 2 episodes of this while playing video games. Pt has noticed it since he started working at a new job.  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  No falls or work injuries reported. The patient has no prior history of stroke no heart disease  The patient saw his primary care physician Dr. Lazarus Salines and was referred to Dr. Allena Katz from neurology.  Normal MRI brain.  The patient is scheduled for EMG to evaluate left upper limb and right lower limb.  In terms of facial numbness patient notes that this is on his upper lip and cheek area as well as his upper gum area.  Has seen DDS and has follow up cleaning scheduled  No falls or injury  Cervical spine x-ray indicates left C6-C7 foraminal stenosis Right C4-C5, C5-C6 foraminal stenosis Pain Inventory Average Pain 8 Pain Right Now 8 My pain is intermittent, tingling and tingling numb esp arms and even mouth goes numb  In the last 24 hours, has pain interfered with the following? General activity 7 Relation with others 0 Enjoyment of life 4 What TIME of day is your pain at its worst? varies Sleep (in general) Good  Pain is worse with:  standing Pain improves with: nothing Relief from Meds: 0  walk without assistance ability to climb steps?  yes do you drive?  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  yes x-rays CT/MRI MRI of brain done to r/o seizures, has body MRI scheduled for 21st  Primary care Lott MD Neurologist can't remember her name (across from hospital)    No family history on file. Social History   Socioeconomic History  . Marital status: Single    Spouse name: Not on file  . Number of children: Not on file  . Years of education: Not on file  . Highest education level: Not on file  Occupational History  . Not on file  Tobacco Use  . Smoking status: Current Every Day Smoker    Packs/day: 1.00    Types: Cigarettes  . Smokeless tobacco: Never Used  Vaping Use  . Vaping Use: Never used  Substance and Sexual Activity  . Alcohol use: Never  . Drug use: Never  . Sexual activity: Not Currently  Other Topics Concern  . Not on file  Social History Narrative   Right handed   Lives with a roommate    Social Determinants of Health   Financial Resource Strain:   . Difficulty of Paying Living Expenses: Not on file  Food Insecurity:   . Worried  About Running Out of Food in the Last Year: Not on file  . Ran Out of Food in the Last Year: Not on file  Transportation Needs:   . Lack of Transportation (Medical): Not on file  . Lack of Transportation (Non-Medical): Not on file  Physical Activity:   . Days of Exercise per Week: Not on file  . Minutes of Exercise per Session: Not on file  Stress:   . Feeling of Stress : Not on file  Social Connections:   . Frequency of Communication with Friends and Family: Not on file  . Frequency of Social Gatherings with Friends and Family: Not on file  . Attends Religious Services: Not on file  . Active Member of Clubs or Organizations: Not on file  . Attends Banker  Meetings: Not on file  . Marital Status: Not on file   No past surgical history on file. Past Medical History:  Diagnosis Date  . Asthma   . Hypertension   . Medical history non-contributory    BP 135/88   Pulse 90   Temp 98.3 F (36.8 C)   Ht 5\' 8"  (1.727 m)   Wt 171 lb 3.2 oz (77.7 kg)   SpO2 96%   BMI 26.03 kg/m   Opioid Risk Score:   Fall Risk Score:  `1  Depression screen PHQ 2/9  Depression screen PHQ 2/9 06/12/2020  Decreased Interest 0  Down, Depressed, Hopeless 0  PHQ - 2 Score 0  Altered sleeping 0  Tired, decreased energy 1  Change in appetite 0  Feeling bad or failure about yourself  0  Trouble concentrating 0  Moving slowly or fidgety/restless 0  Suicidal thoughts 0  PHQ-9 Score 1    Review of Systems  Constitutional: Negative.   HENT: Negative.   Eyes: Negative.   Respiratory: Negative.   Cardiovascular: Negative.   Gastrointestinal: Negative.   Endocrine: Negative.   Genitourinary: Negative.   Musculoskeletal: Negative.   Skin: Negative.   Neurological: Positive for numbness.       Tingling  Hematological: Negative.   Psychiatric/Behavioral: Negative.   All other systems reviewed and are negative.      Objective:   Physical Exam Constitutional:      Appearance: He is normal weight.  HENT:     Head: Normocephalic and atraumatic.  Eyes:     Extraocular Movements: Extraocular movements intact.     Conjunctiva/sclera: Conjunctivae normal.     Pupils: Pupils are equal, round, and reactive to light.  Cardiovascular:     Rate and Rhythm: Normal rate and regular rhythm.  No extrasystoles are present.    Pulses:          Carotid pulses are 2+ on the right side and 2+ on the left side.    Heart sounds: Normal heart sounds. No murmur heard.      Comments: No evidence of carotid bruits on auscultation Musculoskeletal:     Right lower leg: No edema.     Left lower leg: No edema.     Comments: Lumbar spine without evidence of scoliosis or  kyphosis Thoracic spine no evidence of scoliosis or kyphosis No tenderness palpation along the cervical thoracic or lumbar spine. Lumbar range of motion is full no directional pain Cervical spine range of motion is normal Upper extremity range of motion is full at the shoulders elbows wrists and hands. Negative straight leg raising  Skin:    General: Skin is warm and dry.  Neurological:  Mental Status: He is alert and oriented to person, place, and time.     Sensory: Sensation is intact.     Motor: Motor function is intact. No weakness or tremor.     Coordination: Coordination is intact. Coordination normal.     Gait: Gait normal.     Deep Tendon Reflexes:     Reflex Scores:      Tricep reflexes are 3+ on the right side and 3+ on the left side.      Bicep reflexes are 3+ on the right side and 3+ on the left side.      Brachioradialis reflexes are 3+ on the right side and 3+ on the left side.      Patellar reflexes are 3+ on the right side and 3+ on the left side.      Achilles reflexes are 3+ on the right side and 3+ on the left side.    Comments: Minimal Hoffmann's bilaterally Negative foraminal compression test  Sensation intact pinprick in V1 V2 V3 bilaterally Intact pinprick bilateral C5 C6-C7-C8 L2-L3-L4 L5-S1 dermatomal distribution  Motor strength is 5/5 bilateral deltoid bicep tricep grip hip flexion knee extension ankle dorsiflexion  Positive Tinel's left wrist           Assessment & Plan:  #1.  Left upper extremity paresthesias with positive Tinel's but also has mildly positive Hoffmann's.  Question if this is more of a peripheral nerve compression versus cervical radiculopathy/myelopathy.  Agree with work-up per neurology, has EMG/NCV scheduled as well as a cervical MRI scheduled  #2.  Episodic right facial right upper extremity right lower extremity numbness as well as weakness.  No focal neurologic signs today other than hyperactive reflexes in all 4 limbs.   MRI of the brain, given history of hypertension and smoking, would recommend carotid duplex scanning, referral to [facility] for this.  #3.  Occasional back pain does not appear to be very frequent or limiting in terms of his function.  No further work-up recommended at this time  As discussed with patient would first like to complete work-up before initiating treatment for paresthesias.  Patient states they are not really that painful but annoying and he is mainly concerned about the episodic right-sided numbness and weakness. I will see the patient back in 1 month and review work-up

## 2020-06-21 ENCOUNTER — Ambulatory Visit: Payer: Self-pay | Admitting: Neurology

## 2020-06-26 ENCOUNTER — Other Ambulatory Visit: Payer: Self-pay

## 2020-06-26 ENCOUNTER — Ambulatory Visit
Admission: RE | Admit: 2020-06-26 | Discharge: 2020-06-26 | Disposition: A | Discharge status: Home / Self Care | Payer: BC Managed Care – PPO | Source: Ambulatory Visit | Attending: Family Medicine | Admitting: Family Medicine

## 2020-06-26 DIAGNOSIS — M5090 Cervical disc disorder, unspecified, unspecified cervical region: Secondary | ICD-10-CM

## 2020-06-26 DIAGNOSIS — R937 Abnormal findings on diagnostic imaging of other parts of musculoskeletal system: Secondary | ICD-10-CM

## 2020-06-26 DIAGNOSIS — G959 Disease of spinal cord, unspecified: Secondary | ICD-10-CM

## 2020-06-27 ENCOUNTER — Telehealth: Payer: Self-pay | Admitting: *Deleted

## 2020-06-27 DIAGNOSIS — G459 Transient cerebral ischemic attack, unspecified: Secondary | ICD-10-CM

## 2020-06-27 DIAGNOSIS — M79609 Pain in unspecified limb: Secondary | ICD-10-CM

## 2020-06-27 NOTE — Telephone Encounter (Signed)
Dr Wynn Banker requested an order for carotid dopplers at [facility].  Order placed.

## 2020-07-03 ENCOUNTER — Other Ambulatory Visit: Payer: Self-pay

## 2020-07-03 ENCOUNTER — Ambulatory Visit (INDEPENDENT_AMBULATORY_CARE_PROVIDER_SITE_OTHER): Payer: BC Managed Care – PPO | Admitting: Neurology

## 2020-07-03 DIAGNOSIS — R202 Paresthesia of skin: Secondary | ICD-10-CM | POA: Diagnosis not present

## 2020-07-03 NOTE — Procedures (Signed)
[facility]  [address] [place], [address]  [place], [place] [address] Tel: [phone] Fax:  [phone] Test Date:  07/03/2020  Patient: Fernando Ferguson DOB: 1961/08/10 Physician: Nita Sickle, DO  Sex: Male Height: 5\' 8"  Ref Phys: , DO  ID#: Nita Sickle Temp: 32.0C Technician:    Patient Complaints: This is a 59 year old man referred for evaluation of generalized paresthesias of the face, neck, and legs.  NCV & EMG Findings: Electrodiagnostic testing was prematurely terminated at patient's request due to pain.  Nerve conduction studies show:  1. Right sural and superficial peroneal sensory responses are within normal limits. 2. Right peroneal and tibial motor responses are within normal limits  3. Right tibial H reflex study is within normal limits.   4. Needle electrode examination of the anterior tibialis muscle is within normal limits.  Further testing was not performed due to pain.   Impression: This is an incomplete study as needle electrode examination was terminated at patient's request due to pain.  There is no evidence of a sensorimotor polyneuropathy affecting the right lower extremity.   Lumbosacral radiculopathy cannot be excluded.     ___________________________ 60, DO    Nerve Conduction Studies Anti Sensory Summary Table   Stim Site NR Peak (ms) Norm Peak (ms) P-T Amp (V) Norm P-T Amp  Right Sup Peroneal Anti Sensory (Ant Lat Mall)  32C  12 cm    2.3 <4.6 22.9 >4  Right Sural Anti Sensory (Lat Mall)  32C  Calf    2.8 <4.6 20.9 >4   Motor Summary Table   Stim Site NR Onset (ms) Norm Onset (ms) O-P Amp (mV) Norm O-P Amp Site1 Site2 Delta-0 (ms) Dist (cm) Vel (m/s) Norm Vel (m/s)  Right Peroneal Motor (Ext Dig Brev)  32C  Ankle    2.9 <6.0 9.2 >2.5 B Fib Ankle 7.7 39.0 51 >40  B Fib    10.6  8.9  Poplt B Fib 1.7 9.0 53 >40  Poplt    12.3  8.4         Right Tibial Motor (Abd Hall Brev)  32C  Ankle    3.3 <6.0 6.4 >4 Knee  Ankle 8.0 44.0 55 >40  Knee    11.3  6.1          H Reflex Studies   NR H-Lat (ms) Lat Norm (ms) L-R H-Lat (ms)  Right Tibial (Gastroc)  32C     30.61 <35    EMG   Side Muscle Ins Act Fibs Psw Fasc Number Recrt Dur Dur. Amp Amp. Poly Poly. Comment  Right AntTibialis Nml Nml Nml Nml Nml Nml Nml Nml Nml Nml Nml Nml N/A      Waveforms:

## 2020-07-12 ENCOUNTER — Encounter
Payer: BC Managed Care – PPO | Attending: Physical Medicine & Rehabilitation | Admitting: Physical Medicine & Rehabilitation

## 2020-07-12 DIAGNOSIS — M79609 Pain in unspecified limb: Secondary | ICD-10-CM | POA: Insufficient documentation

## 2020-07-12 DIAGNOSIS — R202 Paresthesia of skin: Secondary | ICD-10-CM | POA: Insufficient documentation

## 2020-07-20 ENCOUNTER — Ambulatory Visit: Payer: Self-pay | Admitting: Neurology

## 2020-08-02 ENCOUNTER — Encounter: Payer: Self-pay | Admitting: Physical Medicine & Rehabilitation

## 2020-08-02 ENCOUNTER — Other Ambulatory Visit: Payer: Self-pay

## 2020-08-02 ENCOUNTER — Encounter (HOSPITAL_BASED_OUTPATIENT_CLINIC_OR_DEPARTMENT_OTHER): Payer: BC Managed Care – PPO | Admitting: Physical Medicine & Rehabilitation

## 2020-08-02 VITALS — BP 166/90 | HR 76 | Ht 68.0 in | Wt 172.0 lb

## 2020-08-02 DIAGNOSIS — R202 Paresthesia of skin: Secondary | ICD-10-CM

## 2020-08-02 DIAGNOSIS — M79609 Pain in unspecified limb: Secondary | ICD-10-CM | POA: Diagnosis not present

## 2020-08-02 NOTE — Patient Instructions (Signed)
Left wrist splint from pharmacy wear at night , bedtime  [facility] will call you to schedule Carotid ultrasound to check blood flow to brain  We will check Left wrist ultrasound to evaluate for carpal tunnel

## 2020-08-02 NOTE — Progress Notes (Signed)
Subjective:    Patient ID: Fernando Ferguson, male    DOB: Apr 24, 1961, 59 y.o.   MRN: [id number]  HPI 59 year old male with primary complaints of intermittent episodes of right arm right leg numbness and weakness as well as right hemifacial numbness.  We discussed this at last visit and a referral was made to [facility] however patient never heard back from them.  The patient has had no falls or any persistent weakness.  He has seen his neurologist for EMG/NCV and right lower extremity EMG was normal.  Right upper extremity exam was aborted due to poor tolerance for examination.  In addition his neurologist ordered cervical MRI which showed no evidence of cervical cord abnormalities there is moderate foraminal stenosis right C5-6 and bilateral C6-7.  The patient also has complaints of both constant numbness of the right hand as well as intermittent worsening of numbness in the right hand.  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  He states his company warned the employees of risk for carpal tunnel syndrome.  He has never been tested for carpal tunnel before.  Based on his poor tolerance of EMG/NCV he is reluctant to undergo further testing.  Pain Inventory Average Pain 6 Pain Right Now 1 My pain is tingling  In the last 24 hours, has pain interfered with the following? General activity 5 Relation with others 5 Enjoyment of life 10 What TIME of day is your pain at its worst? morning , daytime, evening and night Sleep (in general) Fair  Pain is worse with: some activites and ? Pain improves with: other Relief from Meds: 2  No family history on file. Social History   Socioeconomic History  . Marital status: Single    Spouse name: Not on file  . Number of children: Not on file  . Years of education: Not on file  . Highest education level: Not on file  Occupational History  . Not on file  Tobacco Use  . Smoking status: Current Every Day Smoker    Packs/day: 1.00    Types:  Cigarettes  . Smokeless tobacco: Never Used  Vaping Use  . Vaping Use: Never used  Substance and Sexual Activity  . Alcohol use: Never  . Drug use: Never  . Sexual activity: Not Currently  Other Topics Concern  . Not on file  Social History Narrative   Right handed   Lives with a roommate    Social Determinants of Health   Financial Resource Strain:   . Difficulty of Paying Living Expenses: Not on file  Food Insecurity:   . Worried About Programme researcher, broadcasting/film/video in the Last Year: Not on file  . Ran Out of Food in the Last Year: Not on file  Transportation Needs:   . Lack of Transportation (Medical): Not on file  . Lack of Transportation (Non-Medical): Not on file  Physical Activity:   . Days of Exercise per Week: Not on file  . Minutes of Exercise per Session: Not on file  Stress:   . Feeling of Stress : Not on file  Social Connections:   . Frequency of Communication with Friends and Family: Not on file  . Frequency of Social Gatherings with Friends and Family: Not on file  . Attends Religious Services: Not on file  . Active Member of Clubs or Organizations: Not on file  . Attends Banker Meetings: Not on file  . Marital Status: Not on file   No past  surgical history on file. No past surgical history on file. Past Medical History:  Diagnosis Date  . Asthma   . Hypertension   . Medical history non-contributory    BP (!) 166/90   Pulse 76   Ht 5\' 8"  (1.727 m)   Wt 172 lb (78 kg)   SpO2 97%   BMI 26.15 kg/m   Opioid Risk Score:   Fall Risk Score:  `1  Depression screen PHQ 2/9  Depression screen PHQ 2/9 06/12/2020  Decreased Interest 0  Down, Depressed, Hopeless 0  PHQ - 2 Score 0  Altered sleeping 0  Tired, decreased energy 1  Change in appetite 0  Feeling bad or failure about yourself  0  Trouble concentrating 0  Moving slowly or fidgety/restless 0  Suicidal thoughts 0  PHQ-9 Score 1    Review of Systems  Constitutional: Negative.   HENT:  Negative.   Eyes: Negative.   Respiratory: Negative.   Cardiovascular: Negative.   Gastrointestinal: Negative.   Endocrine: Negative.   Genitourinary: Negative.   Musculoskeletal: Negative.   Skin: Negative.   Allergic/Immunologic: Negative.   Neurological: Negative.   Hematological: Negative.   Psychiatric/Behavioral: Negative.   All other systems reviewed and are negative.      Objective:   Physical Exam Vitals and nursing note reviewed.  Constitutional:      Appearance: He is obese.  HENT:     Head: Normocephalic and atraumatic.  Eyes:     Extraocular Movements: Extraocular movements intact.     Conjunctiva/sclera: Conjunctivae normal.     Pupils: Pupils are equal, round, and reactive to light.  Musculoskeletal:        General: No tenderness.     Comments: Patient without evidence of atrophy in the left hand.  There is positive Tinel's at the left wrist. Negative foraminal compression test bilaterally  Skin:    General: Skin is warm and dry.  Neurological:     Mental Status: He is alert and oriented to person, place, and time.  Psychiatric:        Mood and Affect: Mood normal.        Behavior: Behavior normal.        Thought Content: Thought content normal.        Judgment: Judgment normal.    Motor strength is 5/5 bilateral deltoid bicep tricep grip. Motor strength is 5/5 bilateral hip flexors knee extensors ankle dorsiflexors and plantar flexors There is no pain with cervical spine range of motion       Assessment & Plan:  #1.  Intermittent right-sided numbness and weakness with normal brain MRI.  His cervical spine foraminal stenosis could account for intermittent hand numbness but not facial or lower extremity.  Reordered carotid Dopplers.  He will follow-up with neurology, Dr. 08/12/2020 on this. 2.  Left hand numbness with positive Tinel's sign at the wrist, ideally he would have an upper extremity EMG to look at either cervical radiculopathy versus carpal  tunnel.  Due to poor tolerance of that study, will do limited wrist ultrasound comparing the proximal and distal segments of the median nerve cross-sectional diameter.  This has a good sensitivity for median neuropathy at the wrist.  Patient will return in about 3 weeks for the study.

## 2020-08-06 ENCOUNTER — Encounter: Payer: Self-pay | Admitting: Neurology

## 2020-08-06 ENCOUNTER — Other Ambulatory Visit: Payer: Self-pay

## 2020-08-06 ENCOUNTER — Ambulatory Visit (INDEPENDENT_AMBULATORY_CARE_PROVIDER_SITE_OTHER): Payer: BC Managed Care – PPO | Admitting: Neurology

## 2020-08-06 VITALS — BP 136/87 | HR 87 | Ht 68.0 in | Wt 172.0 lb

## 2020-08-06 DIAGNOSIS — R29818 Other symptoms and signs involving the nervous system: Secondary | ICD-10-CM

## 2020-08-06 DIAGNOSIS — R202 Paresthesia of skin: Secondary | ICD-10-CM | POA: Diagnosis not present

## 2020-08-06 DIAGNOSIS — R200 Anesthesia of skin: Secondary | ICD-10-CM | POA: Diagnosis not present

## 2020-08-06 NOTE — Patient Instructions (Signed)
We will order CTA head to look at blood flow to your brain

## 2020-08-06 NOTE — Progress Notes (Signed)
Follow-up Visit   Date: 08/06/20   Fernando Ferguson MRN: [id number] DOB: [birth year]   Interim History: Fernando Ferguson is a 59 y.o. right-handed male returning to the clinic for follow-up of right sided paresthesias.  The patient was accompanied to the clinic by self.  History of present illness: Starting in July, he developed relatively acute onset of tingling of the left hand and forearm, lower legs and feet, and teeth. On one occasion, he was unable to stand up which made him concerned, but he did not seek medical care until the following day. He wen to the ER where  MRI brain was normal. He continues to intermitting numbness of the mouth and left hand tingling.  Symptoms sometimes wake him up from sleeping. Cervical XR shows multilevel cervical spondylosis. is PCP has ordered MRI cervical spine.  He denies neck pain, but complains of tingling involving both arms, worse on the left.  He continues to have numbness/tingling of the mouth and soles of the feet (worse on the right).  No imbalance or falls.   He works is a Control and instrumentation engineer.   UPDATE 08/06/2020:  He is here for follow-up visit.  Since his last visit, he reports having two more spells of right teeth, arm, and leg numbness.  When severe, he has difficulty bearing weight on the right side because it feels weak.  It lasts about 15 minutes.  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  He was unable to tolerate NCS/EMG and will be having nerve ultrasound by Dr. Wynn Banker.   Medications:  Current Outpatient Medications on File Prior to Visit  Medication Sig Dispense Refill  . amLODipine (NORVASC) 10 MG tablet Take 10 mg by mouth daily.     No current facility-administered medications on file prior to visit.    Allergies:  Allergies  Allergen Reactions  . Ciprofloxacin Hcl Swelling    Facial swelling     Vital Signs:  BP 136/87   Pulse 87   Ht 5\' 8"  (1.727 m)   Wt 172 lb (78 kg)   SpO2 98%   BMI 26.15  kg/m   Neurological Exam: MENTAL STATUS including orientation to time, place, person, recent and remote memory, attention span and concentration, language, and fund of knowledge is normal.  Speech is not dysarthric.  CRANIAL NERVES:  No visual field defects.  Pupils equal round and reactive to light.  Normal conjugate, extra-ocular eye movements in all directions of gaze.  No ptosis.  Face is symmetric. Palate elevates symmetrically.  Tongue is midline.  MOTOR:  Motor strength is 5/5 in all extremities.  No atrophy, fasciculations or abnormal movements.  No pronator drift.  Tone is normal.    MSRs:  Reflexes are 2+/4 throughout.  SENSORY:  Intact to vibration, temperature, and pin prick throughout.  COORDINATION/GAIT:  Normal finger-to- nose-finger.  Intact rapid alternating movements bilaterally.  Gait narrow based and stable.   Data: NCS/EMG of the right leg 07/03/2020: This is an incomplete study as needle electrode examination was terminated at patient's request due to pain.  There is no evidence of a sensorimotor polyneuropathy affecting the right lower extremity.   Lumbosacral radiculopathy cannot be excluded.    MRI cervical spine 06/26/2020: 1. Disc degeneration with uncovertebral ridging causes foraminal impingement on the right at C5-6 and bilaterally at C6-7. 2. Up to mild spinal stenosis as described.  MRI brain wwo contrast 04/16/2020: Normal brain MRI.  No acute intracranial abnormality identified.  IMPRESSION/PLAN: 1. Transient right side paresthesias.  Prior testing has included MRI brain, MRI cervical spine and NCS of the right leg which was normal.  With is ongoing symptoms, will check intracranial vessels as part of TIA work-up. Check CTA head Will follow-up with US carotids   2. Left hand paresthesias, likely CTS Start wearing wrist splint Appreciate nerve ultrasound by Dr. Wynn Banker  Further recommendations pending results.   Thank you for allowing me to  participate in patient's care.  If I can answer any additional questions, I would be pleased to do so.    Sincerely,    Fernando Stgermain K. Allena Katz, DO

## 2020-08-17 ENCOUNTER — Telehealth: Payer: Self-pay | Admitting: *Deleted

## 2020-08-17 ENCOUNTER — Ambulatory Visit
Admission: RE | Admit: 2020-08-17 | Discharge: 2020-08-17 | Disposition: A | Discharge status: Home / Self Care | Payer: BC Managed Care – PPO | Source: Ambulatory Visit | Attending: Physical Medicine & Rehabilitation | Admitting: Physical Medicine & Rehabilitation

## 2020-08-17 DIAGNOSIS — R221 Localized swelling, mass and lump, neck: Secondary | ICD-10-CM

## 2020-08-17 DIAGNOSIS — R202 Paresthesia of skin: Secondary | ICD-10-CM

## 2020-08-17 NOTE — Telephone Encounter (Signed)
I attempted to contact patient via phone today regarding the results of US carotids , however there was no answer so a message was left for the patient to return my call.    If patient returns my call, please inform him that US shows patent and open carotid arteries, but there is an area of abnormality in the soft tissue which may suggest a lymph node or other lesion.  To better take a look at this, we will order CT neck wwo contrast.

## 2020-08-17 NOTE — Telephone Encounter (Signed)
[facility] called to alert to the results of Mr Shadowens carotid study.  Dr Wynn Banker is out of the office as well, but I see he saw Dr Nita Sickle with symptoms. Dr Nita Sickle is out of office as well.  I wanted to make sure the results are viewed so I will alert Dr Wynn Banker and send to [facility].Marland Kitchen

## 2020-08-17 NOTE — Telephone Encounter (Signed)
Noted. Will inform once pt returns call.

## 2020-08-17 NOTE — Telephone Encounter (Signed)
Forwarded to Dr. Allena Katz, who is in the office today

## 2020-08-20 NOTE — Telephone Encounter (Signed)
Patient returned call and was informed of results and abnormal findings. Informed patient that a CT is recommended to get a close look. Patient agreed to CT and would like it sent to [facility] imaging. Patient had no questions.

## 2020-08-22 ENCOUNTER — Other Ambulatory Visit: Payer: Self-pay

## 2020-08-22 ENCOUNTER — Ambulatory Visit
Admission: RE | Admit: 2020-08-22 | Discharge: 2020-08-22 | Disposition: A | Discharge status: Home / Self Care | Payer: BC Managed Care – PPO | Source: Ambulatory Visit | Attending: Neurology | Admitting: Neurology

## 2020-08-22 DIAGNOSIS — R200 Anesthesia of skin: Secondary | ICD-10-CM

## 2020-08-22 DIAGNOSIS — R202 Paresthesia of skin: Secondary | ICD-10-CM

## 2020-08-22 DIAGNOSIS — R29818 Other symptoms and signs involving the nervous system: Secondary | ICD-10-CM

## 2020-08-22 MED ORDER — IOPAMIDOL (ISOVUE-370) INJECTION 76%
75.0000 mL | Freq: Once | INTRAVENOUS | Status: AC | PRN
  Administered 2020-08-22: 75 mL via INTRAVENOUS

## 2020-08-23 ENCOUNTER — Telehealth: Payer: Self-pay

## 2020-08-23 NOTE — Telephone Encounter (Signed)
-----   Message from Donika K Patel, DO sent at 08/23/2020  1:01 PM EST ----- °Please inform pt that his blood vessels to the brain are normal.   °

## 2020-08-23 NOTE — Telephone Encounter (Signed)
Called patient and left a message for a call back.  

## 2020-08-24 ENCOUNTER — Encounter
Payer: BC Managed Care – PPO | Attending: Physical Medicine & Rehabilitation | Admitting: Physical Medicine & Rehabilitation

## 2020-08-24 ENCOUNTER — Encounter: Payer: Self-pay | Admitting: Physical Medicine & Rehabilitation

## 2020-08-24 ENCOUNTER — Other Ambulatory Visit: Payer: Self-pay

## 2020-08-24 VITALS — BP 156/92 | HR 77 | Temp 98.0°F | Ht 68.0 in | Wt 171.0 lb

## 2020-08-24 DIAGNOSIS — R202 Paresthesia of skin: Secondary | ICD-10-CM | POA: Diagnosis present

## 2020-08-24 DIAGNOSIS — M778 Other enthesopathies, not elsewhere classified: Secondary | ICD-10-CM | POA: Diagnosis not present

## 2020-08-24 NOTE — Patient Instructions (Signed)
No evidence of carpal tunnel in the left wrist  Try Voltaren (diclofenac gel ) to Left wrist and elbow 4 times a day  Referral to PT for left wrist and elbow exercise

## 2020-08-24 NOTE — Progress Notes (Signed)
Limited ultrasound of the left wrist and forearm  Indication left hand tingling and numbness, evaluate for left median nerve compressive neuropathy Patient unable to tolerate EMG/NCV of the left upper extremity  Patient was placed in a seated position.  15 Hz linear transducer was utilized Short axis views of the palmar left wrist at the distal wrist crease were compared to the left distal forearm  The median nerve was identified Cross-sectional area left median nerve at distal forearm 0.83 cm Cross-sectional area left median nerve at distal wrist 1.09 cm  Ratio is 1.3 (normal ratio<1.4)  Impression: No ultrasound evidence of median nerve compression at the wrist  Have referred to physical therapy for left wrist and elbow tendinitis, instructed patient to use Voltaren gel

## 2020-08-27 ENCOUNTER — Ambulatory Visit: Payer: BC Managed Care – PPO | Admitting: Neurology

## 2020-08-27 NOTE — Telephone Encounter (Signed)
-----   Message from Donika K Patel, DO sent at 08/23/2020  1:01 PM EST ----- °Please inform pt that his blood vessels to the brain are normal.   °

## 2020-08-27 NOTE — Telephone Encounter (Signed)
Called patient and left a message for a call back.  

## 2020-08-28 NOTE — Telephone Encounter (Signed)
Called patient and informed him of results. Patient verbalized understanding and had no complaints.

## 2020-08-28 NOTE — Telephone Encounter (Signed)
-----   Message from Glendale Chard, DO sent at 08/23/2020  1:01 PM EST ----- Please inform pt that his blood vessels to the brain are normal.

## 2020-09-10 ENCOUNTER — Ambulatory Visit
Admission: RE | Admit: 2020-09-10 | Discharge: 2020-09-10 | Disposition: A | Discharge status: Home / Self Care | Payer: BC Managed Care – PPO | Source: Ambulatory Visit | Attending: Neurology | Admitting: Neurology

## 2020-09-10 DIAGNOSIS — R221 Localized swelling, mass and lump, neck: Secondary | ICD-10-CM

## 2020-09-10 MED ORDER — IOPAMIDOL (ISOVUE-300) INJECTION 61%
75.0000 mL | Freq: Once | INTRAVENOUS | Status: AC | PRN
  Administered 2020-09-10: 75 mL via INTRAVENOUS

## 2021-06-03 ENCOUNTER — Ambulatory Visit (HOSPITAL_BASED_OUTPATIENT_CLINIC_OR_DEPARTMENT_OTHER): Payer: BC Managed Care – PPO | Admitting: Cardiology

## 2021-07-09 ENCOUNTER — Telehealth: Payer: Self-pay

## 2021-07-09 NOTE — Telephone Encounter (Signed)
NOTES SCANNED TO REFERRAL 

## 2021-10-03 ENCOUNTER — Encounter (HOSPITAL_COMMUNITY): Payer: Self-pay | Admitting: Emergency Medicine

## 2021-10-03 ENCOUNTER — Ambulatory Visit
Admission: EM | Admit: 2021-10-03 | Discharge: 2021-10-03 | Disposition: A | Discharge status: Home / Self Care | Payer: BC Managed Care – PPO

## 2021-10-03 ENCOUNTER — Other Ambulatory Visit: Payer: Self-pay

## 2021-10-03 ENCOUNTER — Ambulatory Visit (HOSPITAL_COMMUNITY)
Admission: EM | Admit: 2021-10-03 | Discharge: 2021-10-03 | Disposition: A | Discharge status: Home / Self Care | Payer: BC Managed Care – PPO | Attending: Student | Admitting: Student

## 2021-10-03 DIAGNOSIS — M5412 Radiculopathy, cervical region: Secondary | ICD-10-CM | POA: Diagnosis not present

## 2021-10-03 MED ORDER — KETOROLAC TROMETHAMINE 30 MG/ML IJ SOLN
INTRAMUSCULAR | Status: AC
  Filled 2021-10-03: qty 1

## 2021-10-03 MED ORDER — METHYLPREDNISOLONE SODIUM SUCC 125 MG IJ SOLR
INTRAMUSCULAR | Status: AC
  Filled 2021-10-03: qty 2

## 2021-10-03 MED ORDER — METHYLPREDNISOLONE SODIUM SUCC 125 MG IJ SOLR
60.0000 mg | Freq: Once | INTRAMUSCULAR | Status: AC
  Administered 2021-10-03: 11:00:00 60 mg via INTRAMUSCULAR

## 2021-10-03 MED ORDER — KETOROLAC TROMETHAMINE 30 MG/ML IJ SOLN
30.0000 mg | Freq: Once | INTRAMUSCULAR | Status: AC
  Administered 2021-10-03: 11:00:00 30 mg via INTRAMUSCULAR

## 2021-10-03 NOTE — ED Triage Notes (Signed)
Pt is present today with right arm, chest, and shoulder pain. Pt stats that he noticed the pain x3 days ago. Pt denies any injury

## 2021-10-03 NOTE — ED Provider Notes (Signed)
[facility]    CSN: [id number] Arrival date & time: 10/03/21  [id number]      History   Chief Complaint Chief Complaint  Patient presents with   Shoulder Pain   Arm Pain    HPI Montey Horaverett Bala is a 60 y.o. male presenting with pain shooting down his right arm for 3 months.  Medical history paresthesias and sciatica in the past, is followed with neurology and physical therapy for this.  Describes 3 months of pain shooting from the shoulder into the wrist, describes this as burning.  Pain seems to be worse with movement, that elevating the arm actually helps sometimes.  He is right-handed.  Endorses overuse but denies trauma.  Denies neck pain, back pain.  States he went to a different urgent care about 3 months ago, they gave him a shot of Toradol and prednisone which helped somewhat.  States that the muscle relaxer did not help at all though.  I do not have access to these records. Has not taken any medications today for this. Denies CP, SOB, dizziness, weakness.   HPI  Past Medical History:  Diagnosis Date   Asthma    Hypertension    Medical history non-contributory     Patient Active Problem List   Diagnosis Date Noted   Adjustment disorder with mixed anxiety and depressed mood 05/14/2019   Major depressive disorder, recurrent, severe with psychotic features (HCC) 05/13/2019   Brief psychotic disorder (HCC) 05/13/2019   MDD (major depressive disorder), recurrent severe, without psychosis (HCC) 05/13/2019    History reviewed. No pertinent surgical history.     Home Medications    Prior to Admission medications   Medication Sig Start Date End Date Taking? Authorizing Provider  amLODipine (NORVASC) 10 MG tablet Take 10 mg by mouth daily. 04/04/19   [provider]  chlorhexidine (PERIDEX) 0.12 % solution SMARTSIG:By Mouth 07/20/20   [provider]    Family History History reviewed. No pertinent family history.  Social History Social History    Tobacco Use   Smoking status: Every Day    Packs/day: 1.00    Types: Cigarettes   Smokeless tobacco: Never  Vaping Use   Vaping Use: Never used  Substance Use Topics   Alcohol use: Not Currently   Drug use: Never     Allergies   Ciprofloxacin hcl   Review of Systems Review of Systems  Musculoskeletal:        R arm pain  All other systems reviewed and are negative.   Physical Exam Triage Vital Signs ED Triage Vitals  Enc Vitals Group     BP 10/03/21 1009 131/87     Pulse Rate 10/03/21 1009 (!) 105     Resp 10/03/21 1009 17     Temp 10/03/21 1009 98.4 F (36.9 C)     Temp Source 10/03/21 1009 Oral     SpO2 10/03/21 1009 96 %     Weight --      Height --      Head Circumference --      Peak Flow --      Pain Score 10/03/21 1008 8     Pain Loc --      Pain Edu? --      Excl. in [place]? --    No data found.  Updated Vital Signs BP 131/87    Pulse (!) 105    Temp 98.4 F (36.9 C) (Oral)    Resp 17    SpO2  96%   Visual Acuity Right Eye Distance:   Left Eye Distance:   Bilateral Distance:    Right Eye Near:   Left Eye Near:    Bilateral Near:     Physical Exam Vitals reviewed.  Constitutional:      General: He is not in acute distress.    Appearance: Normal appearance. He is not ill-appearing.  HENT:     Head: Normocephalic and atraumatic.  Cardiovascular:     Rate and Rhythm: Normal rate and regular rhythm.     Heart sounds: Normal heart sounds.  Pulmonary:     Effort: Pulmonary effort is normal.     Breath sounds: Normal breath sounds and air entry.  Abdominal:     Tenderness: There is no abdominal tenderness. There is no right CVA tenderness, left CVA tenderness, guarding or rebound.  Musculoskeletal:     Cervical back: Normal range of motion. No swelling, deformity, signs of trauma, rigidity, spasms, tenderness, bony tenderness or crepitus. No pain with movement.     Thoracic back: No swelling, deformity, signs of trauma, spasms, tenderness or  bony tenderness. Normal range of motion. No scoliosis.     Lumbar back: No swelling, deformity, signs of trauma, spasms, tenderness or bony tenderness. Normal range of motion. Negative right straight leg raise test and negative left straight leg raise test. No scoliosis.     Comments: No tenderness to palpation- shoulder, arm. Positive R spurling. Strength and sensation intact bilateral arms. No midline spinous tenderness, deformity, stepoff.  Absolutely no other injury, deformity, tenderness, ecchymosis, abrasion.  Neurological:     General: No focal deficit present.     Mental Status: He is alert.     Cranial Nerves: No cranial nerve deficit.  Psychiatric:        Mood and Affect: Mood normal.        Behavior: Behavior normal.        Thought Content: Thought content normal.        Judgment: Judgment normal.     UC Treatments / Results  Labs (all labs ordered are listed, but only abnormal results are displayed) Labs Reviewed - No data to display  EKG   Radiology No results found.  Procedures Procedures (including critical care time)  Medications Ordered in UC Medications  ketorolac (TORADOL) 30 MG/ML injection 30 mg (30 mg Intramuscular Given 10/03/21 1049)  methylPREDNISolone sodium succinate (SOLU-MEDROL) 125 mg/2 mL injection 60 mg (60 mg Intramuscular Given 10/03/21 1051)    Initial Impression / Assessment and Plan / UC Course  I have reviewed the triage vital signs and the nursing notes.  Pertinent labs & imaging results that were available during my care of the patient were reviewed by me and considered in my medical decision making (see chart for details).     This patient is a very pleasant 60 y.o. year old male presenting with R cervical radiculitis. Neurovascularly intact. Long history similar issues, followed by neurology. Has not taken medications today, no history kidney ds; toradol and prednisone administered today. Declines muscle relaxer. F/u with  neurology. .   Final Clinical Impressions(s) / UC Diagnoses   Final diagnoses:  Cervical radiculitis     Discharge Instructions      -You can take Tylenol up to 1000 mg 3 times daily, and ibuprofen up to 600 mg 3 times daily with food.  You can take these together, or alternate every 3-4 hours. -Avoid ibuprofen the rest of the day - you can start  this again tomorrow -Heating pad, gentle stretching  -Please follow-up with physical therapy and neurology if symptoms persist.     ED Prescriptions   None    PDMP not reviewed this encounter.   Rhys Martini, PA-C 10/03/21 1119

## 2021-10-03 NOTE — Discharge Instructions (Addendum)
-  You can take Tylenol up to 1000 mg 3 times daily, and ibuprofen up to 600 mg 3 times daily with food.  You can take these together, or alternate every 3-4 hours. -Avoid ibuprofen the rest of the day - you can start this again tomorrow -Heating pad, gentle stretching  -Please follow-up with physical therapy and neurology if symptoms persist.

## 2021-10-04 ENCOUNTER — Other Ambulatory Visit: Payer: Self-pay

## 2021-10-04 ENCOUNTER — Encounter (HOSPITAL_COMMUNITY): Payer: Self-pay

## 2021-10-04 ENCOUNTER — Ambulatory Visit (HOSPITAL_COMMUNITY)
Admission: EM | Admit: 2021-10-04 | Discharge: 2021-10-04 | Disposition: A | Discharge status: Home / Self Care | Payer: BC Managed Care – PPO | Attending: Internal Medicine | Admitting: Internal Medicine

## 2021-10-04 DIAGNOSIS — M79601 Pain in right arm: Secondary | ICD-10-CM

## 2021-10-04 MED ORDER — METHOCARBAMOL 500 MG PO TABS
500.0000 mg | ORAL_TABLET | Freq: Every day | ORAL | 0 refills | Status: DC
Start: 2021-10-04 — End: 2024-01-31

## 2021-10-04 MED ORDER — IBUPROFEN 600 MG PO TABS
600.0000 mg | ORAL_TABLET | Freq: Four times a day (QID) | ORAL | 0 refills | Status: AC | PRN
Start: 2021-10-04 — End: ?

## 2021-10-04 NOTE — Discharge Instructions (Addendum)
Gentle range of motion exercises Take medications as prescribed Heating pad use only 20 minutes on-20 minutes off cycle Do not drive or operate heavy machinery after taking muscle relaxant It will take a few days for the pain to get significantly better Return to urgent care if symptoms worsen.

## 2021-10-04 NOTE — ED Triage Notes (Signed)
Pt presents with R arm pain X 5 days. States 3 days ago it worsened.   States he was given an injection that did not give relief.   States he cannot keep his arm still.

## 2021-10-07 NOTE — ED Provider Notes (Signed)
[place]    CSN: [id number] Arrival date & time: 10/04/21  1500      History   Chief Complaint Chief Complaint  Patient presents with   Arm Pain    HPI Fernando Ferguson is a 61 y.o. male comes to the urgent care with 5-day history of right arm pain.  Pain started insidiously and has been persistent.  Over the past 3 days the pain has been particularly severe.  Pain is mainly in the right shoulder blade area.  It is associated with some tenderness over the right neck.  No weakness in the upper extremity.  Pain is aggravated by movement.  No known relieving factors.  No bruising. HPI  Past Medical History:  Diagnosis Date   Asthma    Hypertension    Medical history non-contributory     Patient Active Problem List   Diagnosis Date Noted   Adjustment disorder with mixed anxiety and depressed mood 05/14/2019   Major depressive disorder, recurrent, severe with psychotic features ([place]) 05/13/2019   Brief psychotic disorder ([place]) 05/13/2019   MDD (major depressive disorder), recurrent severe, without psychosis ([place]) 05/13/2019    History reviewed. No pertinent surgical history.     Home Medications    Prior to Admission medications   Medication Sig Start Date End Date Taking? Authorizing Provider  ibuprofen (ADVIL) 600 MG tablet Take 1 tablet (600 mg total) by mouth every 6 (six) hours as needed. 10/04/21  Yes Tadeo Besecker, Myrene Galas, MD  methocarbamol (ROBAXIN) 500 MG tablet Take 1 tablet (500 mg total) by mouth at bedtime. 10/04/21  Yes Cloyce Paterson, Myrene Galas, MD  amLODipine (NORVASC) 10 MG tablet Take 10 mg by mouth daily. 04/04/19   [provider]  chlorhexidine (PERIDEX) 0.12 % solution SMARTSIG:By Mouth 07/20/20   [provider]    Family History History reviewed. No pertinent family history.  Social History Social History   Tobacco Use   Smoking status: Every Day    Packs/day: 1.00    Types: Cigarettes   Smokeless tobacco: Never  Vaping  Use   Vaping Use: Never used  Substance Use Topics   Alcohol use: Not Currently   Drug use: Never     Allergies   Ciprofloxacin hcl   Review of Systems Review of Systems  Respiratory: Negative.    Gastrointestinal: Negative.   Musculoskeletal:  Positive for myalgias and neck pain. Negative for arthralgias, gait problem, joint swelling and neck stiffness.    Physical Exam Triage Vital Signs ED Triage Vitals  Enc Vitals Group     BP 10/04/21 1647 137/68     Pulse Rate 10/04/21 1647 (!) 107     Resp 10/04/21 1647 16     Temp 10/04/21 1647 98.3 F (36.8 C)     Temp Source 10/04/21 1647 Oral     SpO2 10/04/21 1647 97 %     Weight --      Height --      Head Circumference --      Peak Flow --      Pain Score 10/04/21 1646 10     Pain Loc --      Pain Edu? --      Excl. in [place]? --    No data found.  Updated Vital Signs BP 137/68 (BP Location: Left Arm)    Pulse (!) 107    Temp 98.3 F (36.8 C) (Oral)    Resp 16    SpO2 97%   Visual Acuity  Right Eye Distance:   Left Eye Distance:   Bilateral Distance:    Right Eye Near:   Left Eye Near:    Bilateral Near:     Physical Exam Vitals and nursing note reviewed.  Constitutional:      General: He is not in acute distress.    Appearance: He is not ill-appearing.  Cardiovascular:     Rate and Rhythm: Normal rate and regular rhythm.  Pulmonary:     Effort: Pulmonary effort is normal.     Breath sounds: Normal breath sounds.  Abdominal:     General: Bowel sounds are normal.     Palpations: Abdomen is soft.  Musculoskeletal:        General: Normal range of motion.  Skin:    General: Skin is warm.     Coloration: Skin is not pale.     Findings: No bruising or erythema.  Neurological:     Mental Status: He is alert.     UC Treatments / Results  Labs (all labs ordered are listed, but only abnormal results are displayed) Labs Reviewed - No data to display  EKG   Radiology No results  found.  Procedures Procedures (including critical care time)  Medications Ordered in UC Medications - No data to display  Initial Impression / Assessment and Plan / UC Course  I have reviewed the triage vital signs and the nursing notes.  Pertinent labs & imaging results that were available during my care of the patient were reviewed by me and considered in my medical decision making (see chart for details).     1.  Right arm pain: Gentle range of motion exercises Ibuprofen as needed for pain Muscle relaxants prescribed-precautions given Heating pad use is recommended If symptoms worsen please return to urgent care to be reevaluated  Final Clinical Impressions(s) / UC Diagnoses   Final diagnoses:  Right arm pain     Discharge Instructions      Gentle range of motion exercises Take medications as prescribed Heating pad use only 20 minutes on-20 minutes off cycle Do not drive or operate heavy machinery after taking muscle relaxant It will take a few days for the pain to get significantly better Return to urgent care if symptoms worsen.   ED Prescriptions     Medication Sig Dispense Auth. Provider   methocarbamol (ROBAXIN) 500 MG tablet Take 1 tablet (500 mg total) by mouth at bedtime. 20 tablet Renette Hsu, Myrene Galas, MD   ibuprofen (ADVIL) 600 MG tablet Take 1 tablet (600 mg total) by mouth every 6 (six) hours as needed. 30 tablet Janicia Monterrosa, Myrene Galas, MD      PDMP not reviewed this encounter.   Chase Picket, MD 10/07/21 [phone]

## 2021-10-08 ENCOUNTER — Other Ambulatory Visit: Payer: Self-pay | Admitting: Family Medicine

## 2021-10-08 ENCOUNTER — Ambulatory Visit
Admission: RE | Admit: 2021-10-08 | Discharge: 2021-10-08 | Disposition: A | Discharge status: Home / Self Care | Payer: BC Managed Care – PPO | Source: Ambulatory Visit | Attending: Family Medicine | Admitting: Family Medicine

## 2021-10-08 DIAGNOSIS — M25511 Pain in right shoulder: Secondary | ICD-10-CM

## 2022-07-01 IMAGING — MR MR HEAD WO/W CM
12 of 14 series · 38 of 48 positions shown · IV contrast (gadavist)
Comparison: Prior CT from earlier the same day.

CLINICAL DATA: Initial evaluation for left-sided numbness.

EXAM:
MRI HEAD WITHOUT AND WITH CONTRAST
TECHNIQUE: Multiplanar, multiecho pulse sequences of the brain and surrounding
structures were obtained without and with intravenous contrast.
CONTRAST:  8mL GADAVIST GADOBUTROL 1 MMOL/ML IV SOLN

[Series 5: DWI · axial · 3.0mm · 0.88mm/px · z∈[-132,+7]mm · 6 of 96 slices shown (1 of 4)]
[im 1/96]
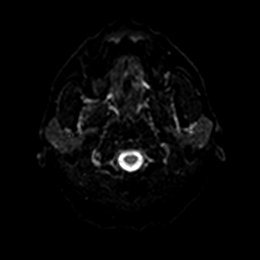
[im 20/96]
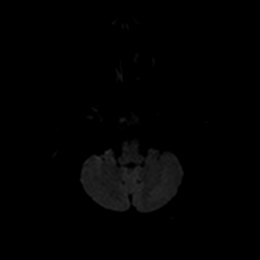
[im 39/96]
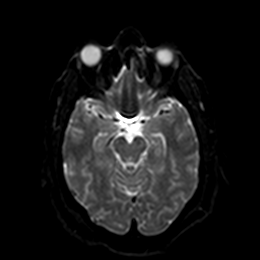
[im 58/96]
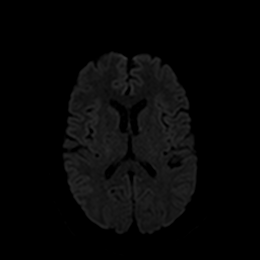
[im 77/96]
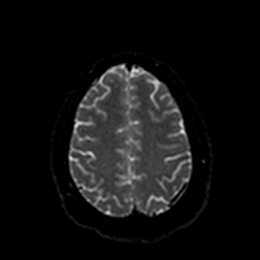
[im 96/96]
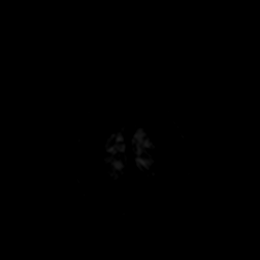

[Series 6: DWI · axial · 3.0mm · 0.88mm/px · z∈[-132,+7]mm · 4 of 48 slices shown (2 of 4)]
[im 1/48]
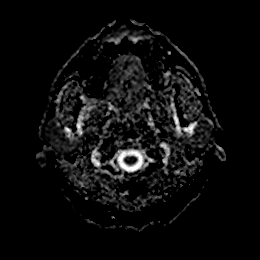
[im 16/48]
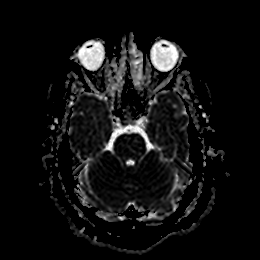
[im 32/48]
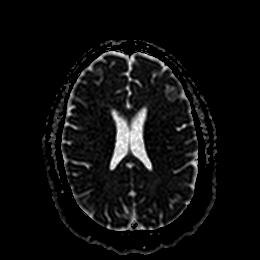
[im 48/48]
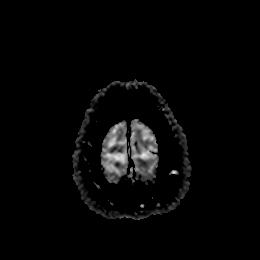

[Series 7: DWI · coronal · 4.0mm · 0.88mm/px · 5 of 64 slices shown (3 of 4)]
[im 1/64]
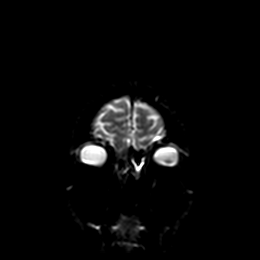
[im 16/64]
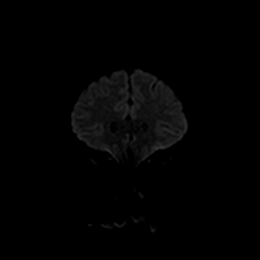
[im 32/64]
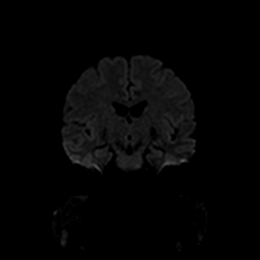
[im 48/64]
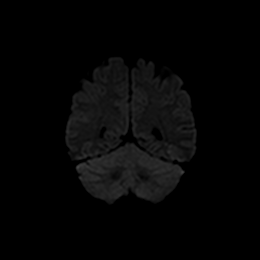
[im 64/64]
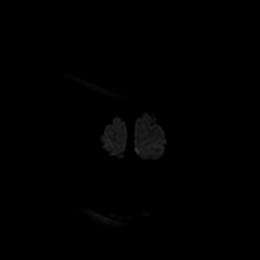

[Series 8: DWI · coronal · 4.0mm · 0.88mm/px · 2 of 32 slices shown (4 of 4)]
[im 1/32]
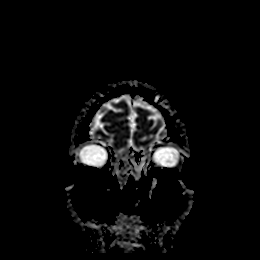
[im 32/32]
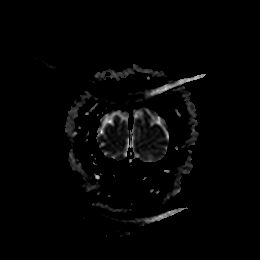

[Series 9: T1 · sagittal · 5.0mm · 0.75mm/px · 2 of 23 slices shown]
[im 1/23]
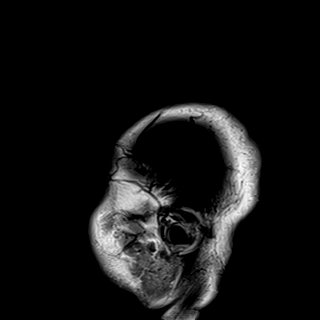
[im 23/23]
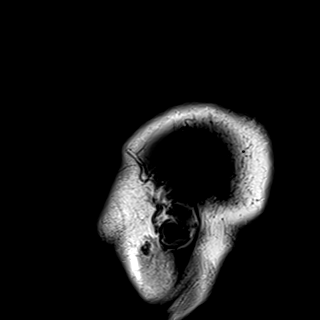

[Series 10: T2 · axial · 5.0mm · 0.72mm/px · z∈[-134,+9]mm · 2 of 25 slices shown]
[im 1/25]
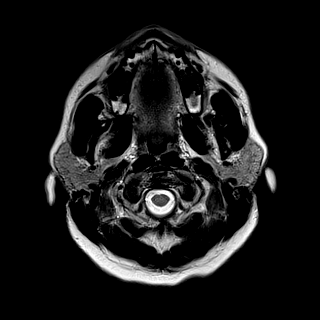
[im 25/25]
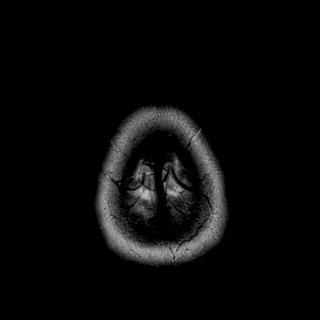

[Series 11: FLAIR · axial · 5.0mm · 0.45mm/px · z∈[-132,+10]mm · 2 of 25 slices shown]
[im 1/25]
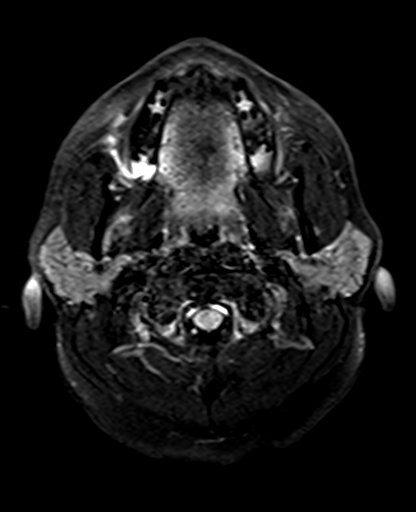
[im 25/25]
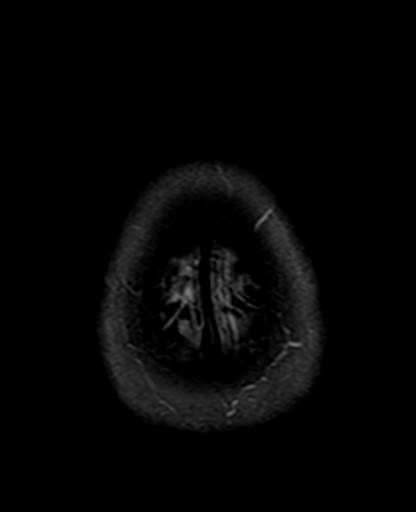

[Series 13: pha_images · axial · 3.0mm · 0.90mm/px · z∈[-145,+23]mm · 4 of 58 slices shown]
[im 1/58]
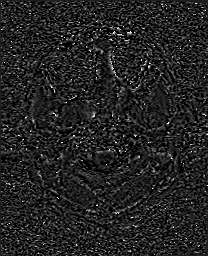
[im 20/58]
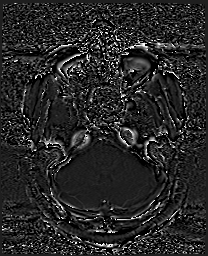
[im 39/58]
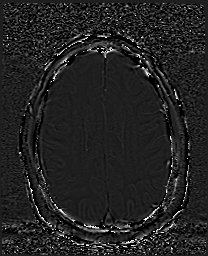
[im 58/58]
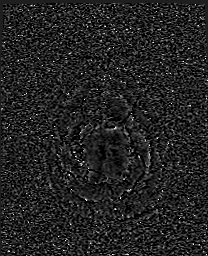

[Series 14: swi_images · axial · 3.0mm · 0.90mm/px · z∈[-148,+26]mm · 5 of 60 slices shown]
[im 1/60]
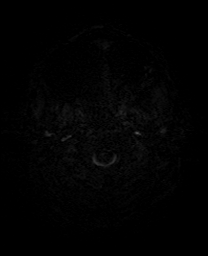
[im 15/60]
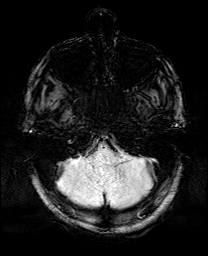
[im 30/60]
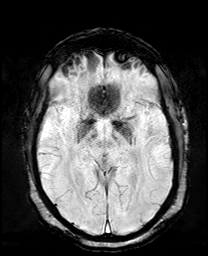
[im 45/60]
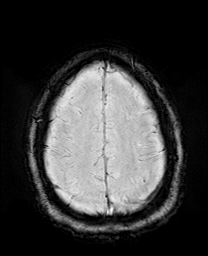
[im 60/60]
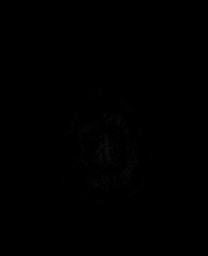

[Series 17: T2 post-contrast · coronal · 5.0mm · 0.72mm/px · 2 of 28 slices shown]
[im 1/28]
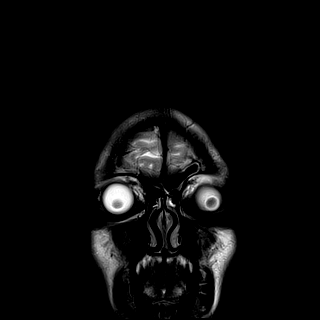
[im 28/28]
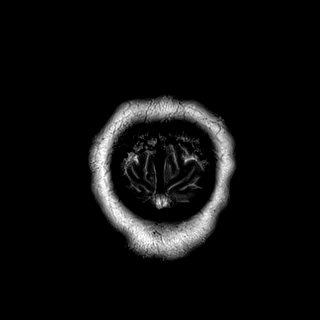

[Series 19: T1 post-contrast · coronal · 5.0mm · 0.34mm/px · 2 of 28 slices shown (1 of 2)]
[im 1/28]
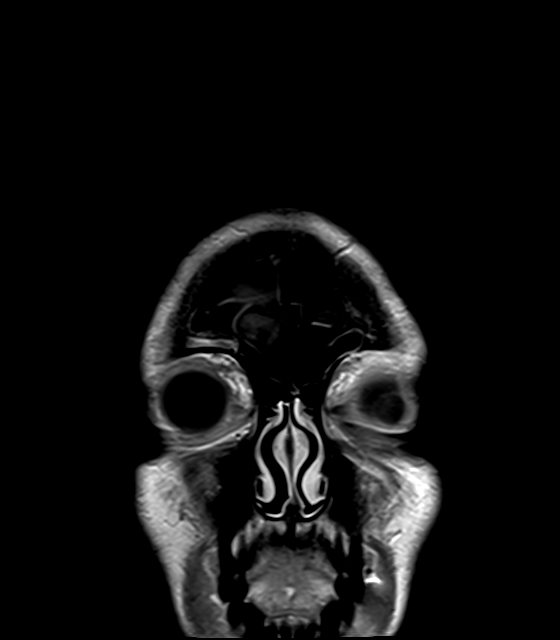
[im 28/28]
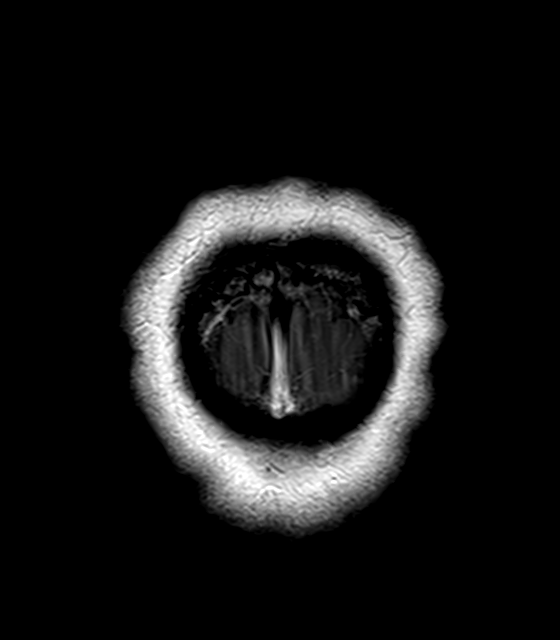

[Series 20: T1 post-contrast · sagittal · 5.0mm · 0.72mm/px · 2 of 23 slices shown (2 of 2)]
[im 1/23]
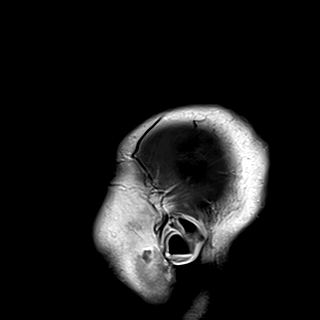
[im 23/23]
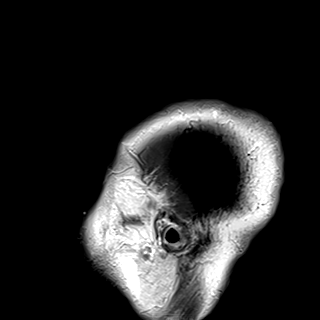

[38 of 48 positions shown; findings below may reference images not displayed]

FINDINGS: Brain: Cerebral volume within normal limits for patient age. No
focal parenchymal signal abnormality identified.

No abnormal foci of restricted diffusion to suggest acute or
subacute ischemia. Gray-white matter differentiation well
maintained. No encephalomalacia to suggest chronic infarction. No
foci of susceptibility artifact to suggest acute or chronic
intracranial hemorrhage.

No mass lesion, midline shift or mass effect. No hydrocephalus. No
extra-axial fluid collection. Major dural sinuses are grossly
patent.

No abnormal enhancement. Few scattered apparent foci of patchy
enhancement seen at the left occipital lobe (series 18, image 25),
favored to be artifactual or possibly vascular in nature.

Pituitary gland and suprasellar region are normal. Midline
structures intact and normal.

Vascular: Major intracranial vascular flow voids well maintained and
normal in appearance.

Skull and upper cervical spine: Craniocervical junction normal.
Visualized upper cervical spine within normal limits. Bone marrow
signal intensity normal. No scalp soft tissue abnormality.

Sinuses/Orbits: Globes and orbital soft tissues within normal
limits.

Scattered mucosal thickening noted throughout the ethmoidal air
cells. Paranasal sinuses are otherwise clear. No mastoid effusion.
Inner ear structures grossly normal.

Other: None.
IMPRESSION: Normal brain MRI.  No acute intracranial abnormality identified.

## 2022-10-20 ENCOUNTER — Emergency Department (HOSPITAL_COMMUNITY)
Admission: EM | Admit: 2022-10-20 | Discharge: 2022-10-21 | Disposition: A | Discharge status: Home / Self Care | Payer: BC Managed Care – PPO | Attending: Emergency Medicine | Admitting: Emergency Medicine

## 2022-10-20 DIAGNOSIS — I10 Essential (primary) hypertension: Secondary | ICD-10-CM | POA: Insufficient documentation

## 2022-10-20 DIAGNOSIS — R443 Hallucinations, unspecified: Secondary | ICD-10-CM | POA: Insufficient documentation

## 2022-10-20 DIAGNOSIS — Z79899 Other long term (current) drug therapy: Secondary | ICD-10-CM | POA: Diagnosis not present

## 2022-10-20 LAB — COMPREHENSIVE METABOLIC PANEL
ALT: 27 U/L (ref 0–44)
AST: 28 U/L (ref 15–41)
Albumin: 3.9 g/dL (ref 3.5–5.0)
Alkaline Phosphatase: 44 U/L (ref 38–126)
Anion gap: 10 (ref 5–15)
BUN: 11 mg/dL (ref 6–20)
CO2: 23 mmol/L (ref 22–32)
Calcium: 9.1 mg/dL (ref 8.9–10.3)
Chloride: 101 mmol/L (ref 98–111)
Creatinine, Ser: 1.16 mg/dL (ref 0.61–1.24)
GFR, Estimated: >60 mL/min (ref >60)
Glucose, Bld: 84 mg/dL (ref 70–99)
Potassium: 3.7 mmol/L (ref 3.5–5.1)
Sodium: 134 mmol/L — ABNORMAL LOW (ref 135–145)
Total Bilirubin: 0.5 mg/dL (ref 0.3–1.2)
Total Protein: 6.8 g/dL (ref 6.5–8.1)

## 2022-10-20 LAB — CBC
HCT: 43.3 % (ref 39.0–52.0)
Hemoglobin: 14.6 g/dL (ref 13.0–17.0)
MCH: 30.5 pg (ref 26.0–34.0)
MCHC: 33.7 g/dL (ref 30.0–36.0)
MCV: 90.6 fL (ref 80.0–100.0)
Platelets: 219 10*3/uL (ref 150–400)
RBC: 4.78 MIL/uL (ref 4.22–5.81)
RDW: 14.2 % (ref 11.5–15.5)
WBC: 7 10*3/uL (ref 4.0–10.5)
nRBC: 0 % (ref 0.0–0.2)

## 2022-10-20 LAB — ETHANOL: Alcohol, Ethyl (B): <10 mg/dL (ref <10)

## 2022-10-20 LAB — SALICYLATE LEVEL: Salicylate Lvl: <7 mg/dL — ABNORMAL LOW (ref 7.0–30.0)

## 2022-10-20 LAB — ACETAMINOPHEN LEVEL: Acetaminophen (Tylenol), Serum: <10 ug/mL — ABNORMAL LOW (ref 10–30)

## 2022-10-20 NOTE — ED Provider Triage Note (Signed)
Emergency Medicine Provider Triage Evaluation Note  Fernando Ferguson , a 62 y.o. male  was evaluated in triage.  Pt complains of brought in by EMS from home for evaluation of visual hallucinations that started "a while ago".  He denies any SI, HI, AVH personally.  He now cannot describe any hallucination activity personally.  Patient is somewhat confused, altered in triage, reports that he does not know why he is here.  He reports that he was at the hospital visiting someone when the ambulance came to get him.  EMS reports however that he was from home.  He denies any physical complaints at this time.  Review of Systems  Positive: hallucinations Negative: Pain, fever  Physical Exam  There were no vitals taken for this visit. Gen:   Awake, no distress   Resp:  Normal effort  MSK:   Moves extremities without difficulty  Other:  Patient with confused affect, he is very calm and cooperative, however his responses to questions did not make much sense, he is unsure why he is here, he does not show signs of responding to internal stimuli  Medical Decision Making  Medically screening exam initiated at 6:34 PM.  Appropriate orders placed.  Fernando Ferguson was informed that the remainder of the evaluation will be completed by another provider, this initial triage assessment does not replace that evaluation, and the importance of remaining in the ED until their evaluation is complete.  Workup initiated in triage    Anselmo Pickler, [place] 10/20/22 8416

## 2022-10-20 NOTE — ED Triage Notes (Signed)
Patient BIB [facility] from home for evaluation of visual hallucinations that started "a while ago". Denies SI/HI. Patient calm, cooperative, and in no apparent distress at this time.  BP 150/90 HR 76 spO2 98% on room air CBG 159

## 2022-10-21 MED ORDER — AMLODIPINE BESYLATE 5 MG PO TABS: 10.0000 mg | ORAL_TABLET | Freq: Every day | ORAL | Status: DC

## 2022-10-21 NOTE — Discharge Instructions (Signed)
I would like you to follow-up with the [facility] behavioral health urgent care they are open 24 hours a day 7 days a week, I will also given you resources for outpatient counseling.  You may also talk with the police who can help assist you in filing an involuntary commitment paperwork if you are concerned for worsening behavior health issues.

## 2022-10-21 NOTE — ED Notes (Signed)
Pt escorted to waiting room by Animal nutritionist. Pt was provided a cab voucher.

## 2022-10-21 NOTE — ED Notes (Signed)
Provider at bedside 

## 2022-10-21 NOTE — ED Provider Notes (Signed)
Notified by nursing staff that patient is requesting to leave.  On reassessment the patient he is alert and orient x 4, he is not endorsing  suicidal/ homicidal ideations, still seems slightly paranoid but shows good insight.  He states that he just wants to go home, there is no other complaints at this time, there is no indication for IVC at this time as he does not pose a threat to himself or others, feel patient can safely follow-up as an outpatient.  Will provide him with outpatient resources.  Patient will be given a taxi voucher to go home, patient's aunt was notified of this as well as outpatient instructions.     Marcello Fennel, PA-C 10/21/22 0500    Ripley Fraise, MD 10/21/22 [phone]

## 2022-10-21 NOTE — ED Notes (Signed)
Pt pacing in hall with folded papers stating that he needs to find his "stickers that say move freely". Pt redirected back to stretcher and given water. Pt also aware that we are awaiting TTS consult.
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
## 2022-10-23 ENCOUNTER — Emergency Department (HOSPITAL_COMMUNITY): Payer: BC Managed Care – PPO

## 2022-10-23 ENCOUNTER — Emergency Department (HOSPITAL_COMMUNITY)
Admission: EM | Admit: 2022-10-23 | Discharge: 2022-10-24 | Disposition: A | Discharge status: Other Acute Inpatient Hospital | Payer: BC Managed Care – PPO | Attending: Emergency Medicine | Admitting: Emergency Medicine

## 2022-10-23 DIAGNOSIS — D72829 Elevated white blood cell count, unspecified: Secondary | ICD-10-CM | POA: Diagnosis not present

## 2022-10-23 DIAGNOSIS — Z1152 Encounter for screening for COVID-19: Secondary | ICD-10-CM | POA: Diagnosis not present

## 2022-10-23 DIAGNOSIS — F333 Major depressive disorder, recurrent, severe with psychotic symptoms: Secondary | ICD-10-CM | POA: Diagnosis not present

## 2022-10-23 DIAGNOSIS — F23 Brief psychotic disorder: Secondary | ICD-10-CM | POA: Diagnosis not present

## 2022-10-23 DIAGNOSIS — M79602 Pain in left arm: Secondary | ICD-10-CM | POA: Diagnosis not present

## 2022-10-23 DIAGNOSIS — I10 Essential (primary) hypertension: Secondary | ICD-10-CM | POA: Insufficient documentation

## 2022-10-23 DIAGNOSIS — Z046 Encounter for general psychiatric examination, requested by authority: Secondary | ICD-10-CM | POA: Diagnosis present

## 2022-10-23 DIAGNOSIS — J45909 Unspecified asthma, uncomplicated: Secondary | ICD-10-CM | POA: Diagnosis not present

## 2022-10-23 DIAGNOSIS — R462 Strange and inexplicable behavior: Secondary | ICD-10-CM

## 2022-10-23 LAB — COMPREHENSIVE METABOLIC PANEL
ALT: 38 U/L (ref 0–44)
AST: 81 U/L — ABNORMAL HIGH (ref 15–41)
Albumin: 4.1 g/dL (ref 3.5–5.0)
Alkaline Phosphatase: 48 U/L (ref 38–126)
Anion gap: 9 (ref 5–15)
BUN: 24 mg/dL — ABNORMAL HIGH (ref 6–20)
CO2: 24 mmol/L (ref 22–32)
Calcium: 9.1 mg/dL (ref 8.9–10.3)
Chloride: 102 mmol/L (ref 98–111)
Creatinine, Ser: 1.45 mg/dL — ABNORMAL HIGH (ref 0.61–1.24)
GFR, Estimated: 56 mL/min — ABNORMAL LOW (ref >60)
Glucose, Bld: 101 mg/dL — ABNORMAL HIGH (ref 70–99)
Potassium: 4 mmol/L (ref 3.5–5.1)
Sodium: 135 mmol/L (ref 135–145)
Total Bilirubin: 0.7 mg/dL (ref 0.3–1.2)
Total Protein: 6.9 g/dL (ref 6.5–8.1)

## 2022-10-23 LAB — URINALYSIS, ROUTINE W REFLEX MICROSCOPIC
Bilirubin Urine: NEGATIVE
Glucose, UA: NEGATIVE mg/dL
Ketones, ur: NEGATIVE mg/dL
Leukocytes,Ua: NEGATIVE
Nitrite: NEGATIVE
Protein, ur: NEGATIVE mg/dL
Specific Gravity, Urine: 1.002 — ABNORMAL LOW (ref 1.005–1.030)
pH: 5 (ref 5.0–8.0)

## 2022-10-23 LAB — CBC WITH DIFFERENTIAL/PLATELET
Abs Immature Granulocytes: 0.06 10*3/uL (ref 0.00–0.07)
Basophils Absolute: 0 10*3/uL (ref 0.0–0.1)
Basophils Relative: 0 %
Eosinophils Absolute: 0 10*3/uL (ref 0.0–0.5)
Eosinophils Relative: 0 %
HCT: 42.5 % (ref 39.0–52.0)
Hemoglobin: 14.4 g/dL (ref 13.0–17.0)
Immature Granulocytes: 1 %
Lymphocytes Relative: 18 %
Lymphs Abs: 2.2 10*3/uL (ref 0.7–4.0)
MCH: 30.6 pg (ref 26.0–34.0)
MCHC: 33.9 g/dL (ref 30.0–36.0)
MCV: 90.4 fL (ref 80.0–100.0)
Monocytes Absolute: 1.1 10*3/uL — ABNORMAL HIGH (ref 0.1–1.0)
Monocytes Relative: 9 %
Neutro Abs: 9 10*3/uL — ABNORMAL HIGH (ref 1.7–7.7)
Neutrophils Relative %: 72 %
Platelets: 227 10*3/uL (ref 150–400)
RBC: 4.7 MIL/uL (ref 4.22–5.81)
RDW: 14.1 % (ref 11.5–15.5)
WBC: 12.4 10*3/uL — ABNORMAL HIGH (ref 4.0–10.5)
nRBC: 0 % (ref 0.0–0.2)

## 2022-10-23 LAB — RESP PANEL BY RT-PCR (RSV, FLU A&B, COVID)  RVPGX2
Influenza A by PCR: NEGATIVE
Influenza B by PCR: NEGATIVE
Resp Syncytial Virus by PCR: NEGATIVE
SARS Coronavirus 2 by RT PCR: NEGATIVE

## 2022-10-23 LAB — RAPID URINE DRUG SCREEN, HOSP PERFORMED
Amphetamines: NOT DETECTED
Barbiturates: NOT DETECTED
Benzodiazepines: NOT DETECTED
Cocaine: NOT DETECTED
Opiates: NOT DETECTED
Tetrahydrocannabinol: NOT DETECTED

## 2022-10-23 LAB — ACETAMINOPHEN LEVEL: Acetaminophen (Tylenol), Serum: <10 ug/mL — ABNORMAL LOW (ref 10–30)

## 2022-10-23 LAB — ETHANOL: Alcohol, Ethyl (B): <10 mg/dL (ref <10)

## 2022-10-23 LAB — SALICYLATE LEVEL: Salicylate Lvl: <7 mg/dL — ABNORMAL LOW (ref 7.0–30.0)

## 2022-10-23 MED ORDER — IBUPROFEN 200 MG PO TABS
600.0000 mg | ORAL_TABLET | Freq: Once | ORAL | Status: AC
  Administered 2022-10-23: 600 mg via ORAL
  Filled 2022-10-23: qty 3

## 2022-10-23 MED ORDER — ALUM & MAG HYDROXIDE-SIMETH 200-200-20 MG/5ML PO SUSP: 30.0000 mL | Freq: Four times a day (QID) | ORAL | Status: DC | PRN

## 2022-10-23 MED ORDER — NICOTINE 21 MG/24HR TD PT24
21.0000 mg | MEDICATED_PATCH | Freq: Every day | TRANSDERMAL | Status: DC
  Administered 2022-10-23 – 2022-10-24 (×2): 21 mg via TRANSDERMAL
  Filled 2022-10-23 (×2): qty 1

## 2022-10-23 MED ORDER — ACETAMINOPHEN 325 MG PO TABS: 650.0000 mg | ORAL_TABLET | ORAL | Status: DC | PRN
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
## 2022-10-23 NOTE — ED Notes (Signed)
Pt returns from CT scan medicated for shoulder pain per order

## 2022-10-23 NOTE — ED Notes (Signed)
Pt Transported to CT scan behavior self controlled and appropriate.
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
## 2022-10-24 MED ORDER — AMLODIPINE BESYLATE 5 MG PO TABS
10.0000 mg | ORAL_TABLET | Freq: Every day | ORAL | Status: DC
  Administered 2022-10-24: 10 mg via ORAL
  Filled 2022-10-24: qty 2

## 2022-10-24 NOTE — ED Provider Notes (Signed)
Emergency Medicine Observation Re-evaluation Note  Fernando Ferguson is a 62 y.o. male, seen on rounds today.  Pt initially presented to the ED for complaints of Psychiatric Evaluation Currently, the patient is resting.  Physical Exam  BP (!) 161/78 (BP Location: Right Arm)   Pulse 71   Temp 98.6 F (37 C) (Oral)   Resp 18   SpO2 100%  Physical Exam General: sleeping Cardiac: regular rate Lungs: breathing easily Psych:   ED Course / MDM  EKG:   I have reviewed the labs performed to date as well as medications administered while in observation.  Recent changes in the last 24 hours include psych eval yesterday.  Plan  Current plan is for reassessment by psychiatry.    Dorie Rank, MD 10/24/22 [phone]
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
## 2022-10-24 NOTE — Progress Notes (Signed)
LCSW Progress Note  [id number]   Fernando Ferguson  10/24/2022  11:18 AM  Description:   Inpatient Psychiatric Referral  Patient was recommended inpatient per [facility], PMHNP. There are no available beds at [facility]. Patient was referred to the following facilities:   Destination  Service Provider Address Phone Fax  [place] [address] [place] [address] [phone]  [facility]  [address] [place] [address] [phone]  [place], [place] [address] [phone]  [facility] [place]  [place], [place] [address] [phone] [place]  [address], [place] [address] [phone]  [facility]  [place], [place] [address] [phone]  [facility]  [address] [place] [address] [phone] [place]  [place], [place] [address] [phone] [facility]  [address] [place] [address] [phone] [facility]  [address] [place], [place] [address] [phone] [facility] [place] [place] [address] [place]  [facility]  [address] [place] [address] [place]  [facility]  [address] [place] [address] [phone]  [facility]  [address] [place] [address] [phone] [place], [place] [address] [phone] [facility]  [address] [place] [address] [phone] [facility]  [address], [place] [address] [place]  [address] [place] [address] [phone]  [facility]  [address] [place] [address] [phone]  [facility]  [address] [place] [address] [phone]  [facility]  [place], [place] [address] [place]  [facility]  [address], [place] [address] [phone] [place]  [address] [place] [address] [phone]  [address] [place] [address] [place]  [facility]  [address], [place] [address] [phone]  [facility]  [address], [place] [address] [phone]  [facility]  [place], [place] [address] [phone]  [facility]  [address], [place] [address] [phone] [facility]  [place], [place] [address] [phone]      Situation ongoing, CSW to continue following and update chart as more information becomes available.      Denna Haggard, [place]  10/24/2022 11:18 AM
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
## 2022-10-24 NOTE — ED Notes (Signed)
Awoke from sleep very concerned about the green light from the buttons that control the movement of his hospital bed. He asked " Is that god that makes the green light"  then began repeating the phrase " it's ground hogs day, it's ground hogs day every day" .  Affect is flat mood labile  and his behavior is bordering bizzare but no aggression verbally or physically noted.

## 2022-10-24 NOTE — ED Notes (Signed)
IVC papers still on process per Secretary. Unable to call sheriff yet for transfer.

## 2022-10-24 NOTE — ED Notes (Addendum)
Patient refused AM BP medications. Patient kept repeating "I take blood pressure pills for my heart" but patient would not physically take medications even after RN encouragement. Tech obtained BP reading of 174/114

## 2022-10-24 NOTE — Progress Notes (Addendum)
Pt was accepted to [facility] 10/24/2022, pending stabilization in BP. Bed assignment: Janora [facility]  Pt meets inpatient criteria per Michaele Offer, [place]  Attending Physician will be Dr. Alcide Clever, MD  Report can be called to: [phone]  Pt can arrive after BP is stable  Care Team Notified: Michaele Offer, PMHNP, Crecencio Mc, RN, Oris Drone, RN, and [address] [place], [place]  10/24/2022 11:41 AM
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
## 2022-10-24 NOTE — ED Provider Notes (Signed)
Provider asked RN to complete a BP on patient due to admission to [facility], BP at 1557 was 167/83, patient has been made IVC by EDP for transportation to [facility].
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
## 2022-10-24 NOTE — ED Notes (Signed)
RN able to encourage patient to take BP mediations successfully

## 2024-01-31 ENCOUNTER — Emergency Department (HOSPITAL_COMMUNITY): Payer: Self-pay

## 2024-01-31 ENCOUNTER — Encounter (HOSPITAL_COMMUNITY): Payer: Self-pay | Admitting: *Deleted

## 2024-01-31 ENCOUNTER — Other Ambulatory Visit: Payer: Self-pay

## 2024-01-31 ENCOUNTER — Emergency Department (HOSPITAL_COMMUNITY)
Admission: EM | Admit: 2024-01-31 | Discharge: 2024-01-31 | Disposition: A | Discharge status: Home / Self Care | Payer: Self-pay | Attending: Emergency Medicine | Admitting: Emergency Medicine

## 2024-01-31 DIAGNOSIS — M79662 Pain in left lower leg: Secondary | ICD-10-CM | POA: Diagnosis present

## 2024-01-31 DIAGNOSIS — X501XXA Overexertion from prolonged static or awkward postures, initial encounter: Secondary | ICD-10-CM | POA: Diagnosis not present

## 2024-01-31 DIAGNOSIS — S76312A Strain of muscle, fascia and tendon of the posterior muscle group at thigh level, left thigh, initial encounter: Secondary | ICD-10-CM

## 2024-01-31 MED ORDER — NAPROXEN 250 MG PO TABS
500.0000 mg | ORAL_TABLET | Freq: Once | ORAL | Status: AC
  Administered 2024-01-31: 500 mg via ORAL
  Filled 2024-01-31: qty 2

## 2024-01-31 MED ORDER — METHOCARBAMOL 750 MG PO TABS: 750.0000 mg | ORAL_TABLET | Freq: Three times a day (TID) | ORAL | 0 refills | Status: AC

## 2024-01-31 MED ORDER — METHOCARBAMOL 500 MG PO TABS
750.0000 mg | ORAL_TABLET | Freq: Once | ORAL | Status: AC
  Administered 2024-01-31: 750 mg via ORAL
  Filled 2024-01-31: qty 2

## 2024-01-31 MED ORDER — NAPROXEN 500 MG PO TABS: 500.0000 mg | ORAL_TABLET | Freq: Two times a day (BID) | ORAL | 0 refills | Status: AC

## 2024-01-31 NOTE — ED Provider Notes (Signed)
 [place] EMERGENCY DEPARTMENT AT [facility] Provider Note   CSN: [id number] Arrival date & time: 01/31/24  1639     History  Chief Complaint  Patient presents with   Leg Pain    Fernando Ferguson is a 63 y.o. male.  Patient is a 63 year old male who presents to the emergency department the chief complaint of pain to the left lower extremity.  Patient notes that symptoms have been ongoing since last night.  Patient notes that last night he was attempting to help someone out of the floor when he fell twisting his left leg.  Patient notes that he had no direct falls to the ground and no associated blunt trauma.  Patient notes that he has had a difficult time with ambulation since that point and feels as though he may have pulled a muscle.  Patient currently denies any associated numbness or paresthesias distally.  He denies any other long bone or joint pain at this time.  He denies any previous injuries or surgeries to the affected extremity.   Leg Pain      Home Medications Prior to Admission medications   Medication Sig Start Date End Date Taking? Authorizing Provider  amLODipine  (NORVASC ) 10 MG tablet Take 10 mg by mouth daily. 04/04/19   [provider]  chlorhexidine (PERIDEX) 0.12 % solution SMARTSIG:By Mouth 07/20/20   [provider]  CLEAR EYES COOLING COMFORT 0.012-0.25-0.25 % SOLN Place 1 drop into both eyes 3 (three) times daily as needed (for irritation).    [provider]  ibuprofen  (ADVIL ) 600 MG tablet Take 1 tablet (600 mg total) by mouth every 6 (six) hours as needed. 10/04/21   Corine Dice, MD  losartan-hydrochlorothiazide (HYZAAR) 50-12.5 MG tablet Take 2 tablets by mouth daily. 02/12/21   [provider]  methocarbamol  (ROBAXIN ) 500 MG tablet Take 1 tablet (500 mg total) by mouth at bedtime. 10/04/21   Corine Dice, MD  sildenafil (VIAGRA) 100 MG tablet Take 100 mg by mouth daily as needed for erectile  dysfunction.    [provider]  Vitamin D, Ergocalciferol, (DRISDOL) 1.25 MG (50000 UNIT) CAPS capsule Take 50,000 Units by mouth once a week. 05/19/22   [provider]      Allergies    Ciprofloxacin hcl and Coconut fatty acid    Review of Systems   Review of Systems  Musculoskeletal:        Upper leg and knee  All other systems reviewed and are negative.   Physical Exam Updated Vital Signs BP (!) 145/94 (BP Location: Right Arm)   Pulse (!) 109   Temp 97.8 F (36.6 C)   Resp 17   Ht 5\' 8"  (1.727 m)   Wt 66.7 kg   SpO2 95%   BMI 22.35 kg/m  Physical Exam Vitals and nursing note reviewed.  Constitutional:      Appearance: Normal appearance.  HENT:     Head: Normocephalic and atraumatic.  Eyes:     Extraocular Movements: Extraocular movements intact.     Conjunctiva/sclera: Conjunctivae normal.     Pupils: Pupils are equal, round, and reactive to light.  Cardiovascular:     Rate and Rhythm: Normal rate and regular rhythm.     Pulses: Normal pulses.  Pulmonary:     Effort: Pulmonary effort is normal. No respiratory distress.  Musculoskeletal:        General: Normal range of motion.     Comments: Tenderness palpation noted over the lateral  aspect of the left upper leg, nontender palpation remainder of bilateral lower extremities, pelvis stable to AP lateral compression, DP and PT pulses are 2+ in the lower extremities, sensation intact distally, full range of motion noted throughout, no ligamentous laxity noted over the left knee, no obvious deformity or bruising, no skin breakdown or ulceration, no lacerations or abrasions  Skin:    General: Skin is warm and dry.  Neurological:     General: No focal deficit present.     Mental Status: He is alert and oriented to person, place, and time. Mental status is at baseline.  Psychiatric:        Mood and Affect: Mood normal.        Behavior: Behavior normal.        Thought Content: Thought content normal.         Judgment: Judgment normal.     ED Results / Procedures / Treatments   Labs (all labs ordered are listed, but only abnormal results are displayed) Labs Reviewed - No data to display  EKG None  Radiology No results found.  Procedures Procedures    Medications Ordered in ED Medications  methocarbamol  (ROBAXIN ) tablet 750 mg (750 mg Oral Given 01/31/24 1708)  naproxen (NAPROSYN) tablet 500 mg (500 mg Oral Given 01/31/24 1708)    ED Course/ Medical Decision Making/ A&P                                 Medical Decision Making Patient is doing well at this time and is stable for discharge home.  Discussed with patient that x-rays demonstrate no signs of acute osseous injury or lesions.  Suspect hamstring strain at this time.  Will continue symptomatic treatment on outpatient basis.  Patient was neurovascularly intact distally in bilateral lower extremities.  Will provide crutches to help with ambulation.  Close follow-up with primary care doctor was discussed as well as strict turn precautions for any new or worsening symptoms.  Patient voiced understanding and had no additional questions.  Amount and/or Complexity of Data Reviewed Radiology: ordered.  Risk Prescription drug management.           Final Clinical Impression(s) / ED Diagnoses Final diagnoses:  None    Rx / DC Orders ED Discharge Orders     None         Roselynn Connors, PA-C 01/31/24 1821    Sueellen Emery, MD 02/01/24 [phone]

## 2024-01-31 NOTE — ED Triage Notes (Signed)
 Pt with left leg since last night, pt believes he may have pulled something. Pt fell last night while helping someone get out of the floor. Denies hitting his head with fall.

## 2024-01-31 NOTE — Discharge Instructions (Signed)
 Please follow-up closely with your primary care doctor on an outpatient basis.  Return to emergency department immediately for any new or worsening symptoms.

## 2024-01-31 NOTE — ED Notes (Signed)
 Patient transported to X-ray

## 2024-02-07 ENCOUNTER — Other Ambulatory Visit: Payer: Self-pay

## 2024-02-07 ENCOUNTER — Emergency Department (HOSPITAL_COMMUNITY)

## 2024-02-07 ENCOUNTER — Encounter (HOSPITAL_COMMUNITY): Payer: Self-pay | Admitting: Emergency Medicine

## 2024-02-07 ENCOUNTER — Emergency Department (HOSPITAL_COMMUNITY)
Admission: EM | Admit: 2024-02-07 | Discharge: 2024-02-07 | Disposition: A | Discharge status: Home / Self Care | Attending: Emergency Medicine | Admitting: Emergency Medicine

## 2024-02-07 DIAGNOSIS — Z79899 Other long term (current) drug therapy: Secondary | ICD-10-CM | POA: Insufficient documentation

## 2024-02-07 DIAGNOSIS — I10 Essential (primary) hypertension: Secondary | ICD-10-CM | POA: Diagnosis not present

## 2024-02-07 DIAGNOSIS — N3001 Acute cystitis with hematuria: Secondary | ICD-10-CM | POA: Insufficient documentation

## 2024-02-07 DIAGNOSIS — S79922A Unspecified injury of left thigh, initial encounter: Secondary | ICD-10-CM | POA: Diagnosis present

## 2024-02-07 DIAGNOSIS — R3129 Other microscopic hematuria: Secondary | ICD-10-CM

## 2024-02-07 DIAGNOSIS — S76312A Strain of muscle, fascia and tendon of the posterior muscle group at thigh level, left thigh, initial encounter: Secondary | ICD-10-CM | POA: Insufficient documentation

## 2024-02-07 DIAGNOSIS — R030 Elevated blood-pressure reading, without diagnosis of hypertension: Secondary | ICD-10-CM

## 2024-02-07 DIAGNOSIS — X501XXA Overexertion from prolonged static or awkward postures, initial encounter: Secondary | ICD-10-CM | POA: Diagnosis not present

## 2024-02-07 DIAGNOSIS — N3091 Cystitis, unspecified with hematuria: Secondary | ICD-10-CM

## 2024-02-07 DIAGNOSIS — T148XXA Other injury of unspecified body region, initial encounter: Secondary | ICD-10-CM

## 2024-02-07 DIAGNOSIS — R399 Unspecified symptoms and signs involving the genitourinary system: Secondary | ICD-10-CM

## 2024-02-07 HISTORY — DX: Paranoid schizophrenia: F20.0

## 2024-02-07 LAB — URINALYSIS, ROUTINE W REFLEX MICROSCOPIC
Bilirubin Urine: NEGATIVE
Glucose, UA: NEGATIVE mg/dL
Ketones, ur: NEGATIVE mg/dL
Nitrite: NEGATIVE
Protein, ur: NEGATIVE mg/dL
RBC / HPF: >50 RBC/hpf (ref 0–5)
Specific Gravity, Urine: 1.014 (ref 1.005–1.030)
pH: 6 (ref 5.0–8.0)

## 2024-02-07 LAB — CBC
HCT: 45.5 % (ref 39.0–52.0)
Hemoglobin: 15.1 g/dL (ref 13.0–17.0)
MCH: 30.6 pg (ref 26.0–34.0)
MCHC: 33.2 g/dL (ref 30.0–36.0)
MCV: 92.3 fL (ref 80.0–100.0)
Platelets: 294 10*3/uL (ref 150–400)
RBC: 4.93 MIL/uL (ref 4.22–5.81)
RDW: 15.8 % — ABNORMAL HIGH (ref 11.5–15.5)
WBC: 9.7 10*3/uL (ref 4.0–10.5)
nRBC: 0 % (ref 0.0–0.2)

## 2024-02-07 LAB — BASIC METABOLIC PANEL WITH GFR
Anion gap: 9 (ref 5–15)
BUN: 9 mg/dL (ref 6–20)
CO2: 27 mmol/L (ref 22–32)
Calcium: 9 mg/dL (ref 8.9–10.3)
Chloride: 99 mmol/L (ref 98–111)
Creatinine, Ser: 0.94 mg/dL (ref 0.61–1.24)
GFR, Estimated: >60 mL/min (ref >60)
Glucose, Bld: 82 mg/dL (ref 70–99)
Potassium: 4.2 mmol/L (ref 3.5–5.1)
Sodium: 135 mmol/L (ref 135–145)

## 2024-02-07 MED ORDER — CEPHALEXIN 500 MG PO CAPS
500.0000 mg | ORAL_CAPSULE | Freq: Four times a day (QID) | ORAL | 0 refills | Status: DC
  Filled 2024-02-07: qty 20, 5d supply, fill #0

## 2024-02-07 MED ORDER — AZITHROMYCIN 250 MG PO TABS
1000.0000 mg | ORAL_TABLET | Freq: Once | ORAL | Status: AC
  Administered 2024-02-07: 1000 mg via ORAL
  Filled 2024-02-07: qty 4

## 2024-02-07 MED ORDER — SODIUM CHLORIDE 0.9 % IV SOLN
1.0000 g | Freq: Once | INTRAVENOUS | Status: AC
  Administered 2024-02-07: 1 g via INTRAVENOUS
  Filled 2024-02-07: qty 10

## 2024-02-07 MED ORDER — CEPHALEXIN 500 MG PO CAPS
500.0000 mg | ORAL_CAPSULE | Freq: Four times a day (QID) | ORAL | 0 refills | Status: AC
Start: 2024-02-07 — End: ?

## 2024-02-07 NOTE — ED Triage Notes (Signed)
 Pt c/o of left leg swelling / pain x1 week. Was seen for the same but now states he has a rash to the back side of the leg and is having difficulty urinating.

## 2024-02-07 NOTE — ED Notes (Signed)
 Patient transported to CT

## 2024-02-07 NOTE — ED Provider Notes (Signed)
 [place] EMERGENCY DEPARTMENT AT [facility] Provider Note   CSN: [id number] Arrival date & time: 02/07/24  1520     History  Chief Complaint  Patient presents with   Leg Swelling    Fernando Ferguson is a 63 y.o. male.  Pt indicates 4/26, strained his proximal hamstring/lower left buttock area trying to pick up someone off the floor - felt immediately pull/pain to area then. Was seen in ED, xrays neg. Indicates now has brusing to skin in area and also to medial left thigh. In past 1-2 days indicates urinary urgency, going small amounts. No dysuria. No hx uti. No hx prostate problems. No penile discharge. No known exposure. No abd pain or nv. No scrotal or testicular pain or swelling. Normal appetite. No nv. No fever or chills. Denies anticoagulant use.   The history is provided by the patient.       Home Medications Prior to Admission medications   Medication Sig Start Date End Date Taking? Authorizing Provider  amLODipine  (NORVASC ) 10 MG tablet Take 10 mg by mouth daily. 04/04/19   [provider]  chlorhexidine (PERIDEX) 0.12 % solution SMARTSIG:By Mouth 07/20/20   [provider]  CLEAR EYES COOLING COMFORT 0.012-0.25-0.25 % SOLN Place 1 drop into both eyes 3 (three) times daily as needed (for irritation).    [provider]  ibuprofen  (ADVIL ) 600 MG tablet Take 1 tablet (600 mg total) by mouth every 6 (six) hours as needed. 10/04/21   Corine Dice, MD  losartan-hydrochlorothiazide (HYZAAR) 50-12.5 MG tablet Take 2 tablets by mouth daily. 02/12/21   [provider]  methocarbamol  (ROBAXIN -750) 750 MG tablet Take 1 tablet (750 mg total) by mouth 3 (three) times daily. 01/31/24   Roselynn Connors, PA-C  naproxen  (NAPROSYN ) 500 MG tablet Take 1 tablet (500 mg total) by mouth 2 (two) times daily. 01/31/24   Roselynn Connors, PA-C  sildenafil (VIAGRA) 100 MG tablet Take 100 mg by mouth daily as needed for erectile dysfunction.     [provider]  Vitamin D, Ergocalciferol, (DRISDOL) 1.25 MG (50000 UNIT) CAPS capsule Take 50,000 Units by mouth once a week. 05/19/22   [provider]      Allergies    Ciprofloxacin hcl and Coconut fatty acid    Review of Systems   Review of Systems  Constitutional:  Negative for chills and fever.  Respiratory:  Negative for shortness of breath.   Cardiovascular:  Negative for chest pain.  Gastrointestinal:  Negative for abdominal pain, nausea and vomiting.  Genitourinary:  Positive for urgency. Negative for flank pain, scrotal swelling and testicular pain.  Musculoskeletal:  Negative for back pain.  Skin:  Negative for rash.  Neurological:  Negative for weakness and numbness.    Physical Exam Updated Vital Signs BP 118/81 (BP Location: Right Arm)   Pulse 95   Temp 98.9 F (37.2 C) (Oral)   Resp 15   Ht 1.727 m (5\' 8" )   Wt 66.7 kg   SpO2 98%   BMI 22.35 kg/m  Physical Exam Vitals and nursing note reviewed.  Constitutional:      Appearance: Normal appearance. He is well-developed.  HENT:     Head: Atraumatic.     Nose: Nose normal.     Mouth/Throat:     Mouth: Mucous membranes are moist.  Eyes:     General: No scleral icterus.    Conjunctiva/sclera: Conjunctivae normal.  Neck:     Trachea: No tracheal deviation.  Cardiovascular:     Rate and Rhythm: Normal rate.     Pulses: Normal pulses.  Pulmonary:     Effort: Pulmonary effort is normal. No accessory muscle usage or respiratory distress.  Abdominal:     General: Bowel sounds are normal. There is no distension.     Palpations: Abdomen is soft. There is no mass.     Tenderness: There is no abdominal tenderness. There is no guarding.  Genitourinary:    Comments: No cva tenderness.normal external gu exam. No penile discharge.  Musculoskeletal:        General: No swelling.     Cervical back: Neck supple.     Comments: L/S spine non tender, aligned, no step off. Pain to left inferior  buttock and proximal left hamstring region. Skin intact. Mild bruising to proximal and medial thigh. Compartments of thigh/leg are soft, not tense, no significant swelling noted. Normal passive rom left hip and knee without pain. LLE is of normal color and warmth, intact distal pulses.   Skin:    General: Skin is warm and dry.     Findings: No rash.  Neurological:     Mental Status: He is alert.     Comments: Alert, speech clear. LLE nvi with intact motor/sens fxn.   Psychiatric:        Mood and Affect: Mood normal.     ED Results / Procedures / Treatments   Labs (all labs ordered are listed, but only abnormal results are displayed) Results for orders placed or performed during the hospital encounter of 02/07/24  Urinalysis, Routine w reflex microscopic -Urine, Clean Catch   Collection Time: 02/07/24  4:19 PM  Result Value Ref Range   Color, Urine YELLOW YELLOW   APPearance CLOUDY (A) CLEAR   Specific Gravity, Urine 1.014 1.005 - 1.030   pH 6.0 5.0 - 8.0   Glucose, UA NEGATIVE NEGATIVE mg/dL   Hgb urine dipstick MODERATE (A) NEGATIVE   Bilirubin Urine NEGATIVE NEGATIVE   Ketones, ur NEGATIVE NEGATIVE mg/dL   Protein, ur NEGATIVE NEGATIVE mg/dL   Nitrite NEGATIVE NEGATIVE   Leukocytes,Ua LARGE (A) NEGATIVE   RBC / HPF >50 0 - 5 RBC/hpf   WBC, UA 0-5 0 - 5 WBC/hpf   Bacteria, UA RARE (A) NONE SEEN   Squamous Epithelial / HPF 0-5 0 - 5 /HPF   WBC Clumps PRESENT   Basic metabolic panel with GFR   Collection Time: 02/07/24  4:34 PM  Result Value Ref Range   Sodium 135 135 - 145 mmol/L   Potassium 4.2 3.5 - 5.1 mmol/L   Chloride 99 98 - 111 mmol/L   CO2 27 22 - 32 mmol/L   Glucose, Bld 82 70 - 99 mg/dL   BUN 9 6 - 20 mg/dL   Creatinine, Ser 1.61 0.61 - 1.24 mg/dL   Calcium 9.0 8.9 - 09.6 mg/dL   GFR, Estimated >04 >54 mL/min   Anion gap 9 5 - 15  CBC   Collection Time: 02/07/24  4:34 PM  Result Value Ref Range   WBC 9.7 4.0 - 10.5 K/uL   RBC 4.93 4.22 - 5.81 MIL/uL    Hemoglobin 15.1 13.0 - 17.0 g/dL   HCT 09.8 11.9 - 14.7 %   MCV 92.3 80.0 - 100.0 fL   MCH 30.6 26.0 - 34.0 pg   MCHC 33.2 30.0 - 36.0 g/dL   RDW 82.9 (H) 56.2 - 13.0 %   Platelets 294 150 - 400 K/uL  nRBC 0.0 0.0 - 0.2 %   CT Renal Stone Study Result Date: 02/07/2024 CLINICAL DATA:  Abdominal and flank pain. EXAM: CT ABDOMEN AND PELVIS WITHOUT CONTRAST TECHNIQUE: Multidetector CT imaging of the abdomen and pelvis was performed following the standard protocol without IV contrast. RADIATION DOSE REDUCTION: This exam was performed according to the departmental dose-optimization program which includes automated exposure control, adjustment of the mA and/or kV according to patient size and/or use of iterative reconstruction technique. COMPARISON:  None Available. FINDINGS: Lower chest: No acute abnormality. Hepatobiliary: Subcentimeter hypodensity in the left lobe of the liver is too small to characterize, likely a cyst. Otherwise, the liver, gallbladder and bile ducts are within normal limits. Pancreas: Unremarkable. No pancreatic ductal dilatation or surrounding inflammatory changes. Spleen: Normal in size without focal abnormality. Adrenals/Urinary Tract: Low-density right adrenal nodules are present compatible with adenomas measuring up to 11 mm. Left adrenal gland is within normal limits. There are cysts in the left kidney with the largest measuring 7 cm. There is a single 3 mm calculus in the right kidney. There is no hydronephrosis in either kidney. The bladder is within normal limits. Stomach/Bowel: Stomach is within normal limits. Appendix is not seen. No evidence of bowel wall thickening, distention, or inflammatory changes. Vascular/Lymphatic: Aortic atherosclerosis. No enlarged abdominal or pelvic lymph nodes. Reproductive: Prostate gland is mildly enlarged. Other: No abdominal wall hernia or abnormality. No abdominopelvic ascites. Musculoskeletal: There are degenerative changes at L4-L5. edema  seen in the proximal posterior intramuscular compartment on the left, partially imaged. No evidence for soft tissue gas. IMPRESSION: 1. No acute localizing process in the abdomen or pelvis. 2. Nonobstructing right renal calculus. 3. Left renal cysts.  No follow-up imaging recommended. 4. Right adrenal adenomas. 5. Edema in the proximal posterior intramuscular compartment on the left, partially imaged. Correlate clinically for infection. 6. Aortic atherosclerosis. Aortic Atherosclerosis (ICD10-I70.0). Electronically Signed   By: Tyron Gallon M.D.   On: 02/07/2024 19:39   DG Femur Min 2 Views Left Result Date: 01/31/2024 CLINICAL DATA:  Marvell Slider.  Left leg pain. EXAM: LEFT FEMUR 2 VIEWS COMPARISON:  None Available. FINDINGS: Examination is limited by clothing artifact. The left hip and knee joints are maintained. No acute left femur fracture is identified. IMPRESSION: No acute bony findings. Electronically Signed   By: Marrian Siva M.D.   On: 01/31/2024 19:38    EKG None  Radiology CT Renal Stone Study Result Date: 02/07/2024 CLINICAL DATA:  Abdominal and flank pain. EXAM: CT ABDOMEN AND PELVIS WITHOUT CONTRAST TECHNIQUE: Multidetector CT imaging of the abdomen and pelvis was performed following the standard protocol without IV contrast. RADIATION DOSE REDUCTION: This exam was performed according to the departmental dose-optimization program which includes automated exposure control, adjustment of the mA and/or kV according to patient size and/or use of iterative reconstruction technique. COMPARISON:  None Available. FINDINGS: Lower chest: No acute abnormality. Hepatobiliary: Subcentimeter hypodensity in the left lobe of the liver is too small to characterize, likely a cyst. Otherwise, the liver, gallbladder and bile ducts are within normal limits. Pancreas: Unremarkable. No pancreatic ductal dilatation or surrounding inflammatory changes. Spleen: Normal in size without focal abnormality. Adrenals/Urinary  Tract: Low-density right adrenal nodules are present compatible with adenomas measuring up to 11 mm. Left adrenal gland is within normal limits. There are cysts in the left kidney with the largest measuring 7 cm. There is a single 3 mm calculus in the right kidney. There is no hydronephrosis in either kidney. The bladder is within  normal limits. Stomach/Bowel: Stomach is within normal limits. Appendix is not seen. No evidence of bowel wall thickening, distention, or inflammatory changes. Vascular/Lymphatic: Aortic atherosclerosis. No enlarged abdominal or pelvic lymph nodes. Reproductive: Prostate gland is mildly enlarged. Other: No abdominal wall hernia or abnormality. No abdominopelvic ascites. Musculoskeletal: There are degenerative changes at L4-L5. edema seen in the proximal posterior intramuscular compartment on the left, partially imaged. No evidence for soft tissue gas. IMPRESSION: 1. No acute localizing process in the abdomen or pelvis. 2. Nonobstructing right renal calculus. 3. Left renal cysts.  No follow-up imaging recommended. 4. Right adrenal adenomas. 5. Edema in the proximal posterior intramuscular compartment on the left, partially imaged. Correlate clinically for infection. 6. Aortic atherosclerosis. Aortic Atherosclerosis (ICD10-I70.0). Electronically Signed   By: Tyron Gallon M.D.   On: 02/07/2024 19:39    Procedures Procedures    Medications Ordered in ED Medications  azithromycin (ZITHROMAX) tablet 1,000 mg (has no administration in time range)  cefTRIAXone (ROCEPHIN) 1 g in sodium chloride  0.9 % 100 mL IVPB (1 g Intravenous New Bag/Given 02/07/24 1910)    ED Course/ Medical Decision Making/ A&P                                 Medical Decision Making Problems Addressed: Bruising: acute illness or injury Elevated blood pressure reading: acute illness or injury Essential hypertension: chronic illness or injury with exacerbation, progression, or side effects of treatment that  poses a threat to life or bodily functions Hemorrhagic cystitis: acute illness or injury Other microscopic hematuria: acute illness or injury Strain of left hamstring muscle, initial encounter: acute illness or injury Urinary symptom or sign: acute illness or injury with systemic symptoms  Amount and/or Complexity of Data Reviewed External Data Reviewed: radiology and notes. Labs: ordered. Decision-making details documented in ED Course. Radiology: ordered and independent interpretation performed. Decision-making details documented in ED Course.  Risk Prescription drug management. Decision regarding hospitalization.   Labs ordered/sent.   Differential diagnosis includes  . Dispo decision including potential need for admission considered - will get labs and reassess.   Reviewed nursing notes and prior charts for additional history. External reports reviewed.   Labs reviewed/interpreted by me - wbc and hgb normal.   Recent xrays reviewed/interpreted by me - no fx.   CT reviewed/interpreted by me - no ureteral stone - multiple radiology incidental findings, shared w patient and rec pcp f/u.   Recheck abd soft non tender. Able to void.   Rocephin iv for possible uti/hemorrhagic cystitis. Pcp f/u.   Pt currently appears stable for ed d/c.   Rec close pcp f/u.  Return precautions provided.          Final Clinical Impression(s) / ED Diagnoses Final diagnoses:  Strain of left hamstring muscle, initial encounter  Bruising  Urinary symptom or sign  Elevated blood pressure reading  Essential hypertension  Other microscopic hematuria  Hemorrhagic cystitis    Rx / DC Orders ED Discharge Orders     None         Guadalupe Lee, MD 02/07/24 1954

## 2024-02-07 NOTE — Discharge Instructions (Addendum)
 It was our pleasure to provide your ER care today - we hope that you feel better. Drink plenty of fluids/tay well hydrated. Take acetaminophen  or ibuprofen  as need. Take keflex (antibiotic) as prescribed.   Your ct scan was read as showing: 1. No acute localizing process in the abdomen or pelvis.  2. Nonobstructing right renal calculus. 3. Left renal cysts.  No follow-up imaging recommended. 4. Right adrenal adenomas. 5. Edema in the proximal posterior intramuscular compartment on the left, partially imaged. Correlate clinically for infection. 6. Aortic atherosclerosis.    Follow up with primary care doctor in the next 1-2 weeks - discuss above incidental CT scan findings with primary care doctor and have them arrange appropriate follow up - also follow up regarding today's symptoms, blood in urine, and have your blood pressure rechecked then as it is high today.  Return to ER if worse, new symptoms, fevers, severe/intractable pain, severe abdominal pain, unable to void, or other concern.

## 2024-02-15 ENCOUNTER — Ambulatory Visit (INDEPENDENT_AMBULATORY_CARE_PROVIDER_SITE_OTHER): Admitting: Physician Assistant

## 2024-02-15 ENCOUNTER — Encounter: Payer: Self-pay | Admitting: Physician Assistant

## 2024-02-15 DIAGNOSIS — S76312A Strain of muscle, fascia and tendon of the posterior muscle group at thigh level, left thigh, initial encounter: Secondary | ICD-10-CM | POA: Insufficient documentation

## 2024-02-15 NOTE — Progress Notes (Signed)
 Office Visit Note   Patient: Fernando Ferguson           Date of Birth: [birth year]           MRN: [id number] Visit Date: 02/15/2024              Requested by: Luba Running, FNP [address] [place],  [place] [address] PCP: Luba Running, FNP   Assessment & Plan: Visit Diagnoses:  1. Hamstring strain, left, initial encounter     Plan: Patient is a pleasant 63 year old gentleman who is 2 weeks status post injury to his left hamstring when he was bending over as he was trying to help someone out.  He has been to the emergency room twice.  Findings today are consistent with a hamstring strain.  I do not think this is radicular in nature but certainly cannot rule that out.  He has already been given a referral to physical therapy I told him I thought this would be appropriate.  He should continue on anti-inflammatories if he can tolerate them here.  Would like to see him back in a week.  He may wean off the crutches as tolerated  Follow-Up Instructions: Return in about 4 weeks (around 03/14/2024).   Orders:  No orders of the defined types were placed in this encounter.  No orders of the defined types were placed in this encounter.     Procedures: No procedures performed   Clinical Data: No additional findings.   Subjective: Chief Complaint  Patient presents with   Left Leg - Pain    HPI pleasant 63 year old gentleman comes in with a chief complaint of left posterior buttock pain status post injury a couple weeks ago.  He said he was bending over to help someone and his leg gave out.  He has some numbness in both his feet but this is been there before the injury.  He complains of pain not in the groin but of the base of the left posterior buttock that radiates down the leg.  Denies any weakness.  Review of Systems  All other systems reviewed and are negative.    Objective: Vital Signs: There were no vitals taken for this visit.  Physical Exam Constitutional:       Appearance: Normal appearance.  Pulmonary:     Effort: Pulmonary effort is normal.  Skin:    General: Skin is warm and dry.  Neurological:     General: No focal deficit present.     Mental Status: He is alert and oriented to person, place, and time.  Psychiatric:        Mood and Affect: Mood normal.        Behavior: Behavior normal.     Ortho Exam Examination he has no current ecchymosis though he did say he had some initially.  No pain with manipulation of his hip.  He has good strength with dorsiflexion plantar flexion extension and flexion of his legs.  Extension of his leg flexion recreates the pain going from the posterior buttock at the insertion of the proximal hamstring down the back of his leg compartments are soft and nontender he is neurovascular intact Specialty Comments:  No specialty comments available.  Imaging: No results found.   PMFS History: Patient Active Problem List   Diagnosis Date Noted   Hamstring strain, left, initial encounter 02/15/2024   Adjustment disorder with mixed anxiety and depressed mood 05/14/2019   Major depressive disorder, recurrent, severe with psychotic  features (HCC) 05/13/2019   Brief psychotic disorder (HCC) 05/13/2019   MDD (major depressive disorder), recurrent severe, without psychosis (HCC) 05/13/2019   Past Medical History:  Diagnosis Date   Asthma    Hypertension    Medical history non-contributory    Paranoid schizophrenia (HCC)     History reviewed. No pertinent family history.  Past Surgical History:  Procedure Laterality Date   APPENDECTOMY     Social History   Occupational History   Not on file  Tobacco Use   Smoking status: Every Day    Current packs/day: 1.00    Types: Cigarettes   Smokeless tobacco: Never  Vaping Use   Vaping status: Never Used  Substance and Sexual Activity   Alcohol use: Yes    Comment: daily   Drug use: Never   Sexual activity: Not Currently

## 2024-08-08 ENCOUNTER — Encounter: Payer: Self-pay | Admitting: Radiology
